# Patient Record
Sex: Male | Born: 1935 | Race: White | Hispanic: No | Marital: Married | Smoking: Never smoker
Health system: Southern US, Community
[De-identification: ages and names within clinical notes are randomized; demographics above are authoritative.]

## PROBLEM LIST (undated history)

## (undated) DIAGNOSIS — J1100 Influenza due to unidentified influenza virus with unspecified type of pneumonia: Secondary | ICD-10-CM

## (undated) DIAGNOSIS — E119 Type 2 diabetes mellitus without complications: Secondary | ICD-10-CM

## (undated) DIAGNOSIS — A35 Other tetanus: Secondary | ICD-10-CM

## (undated) DIAGNOSIS — E079 Disorder of thyroid, unspecified: Secondary | ICD-10-CM

## (undated) DIAGNOSIS — E1142 Type 2 diabetes mellitus with diabetic polyneuropathy: Secondary | ICD-10-CM

## (undated) DIAGNOSIS — I10 Essential (primary) hypertension: Secondary | ICD-10-CM

## (undated) DIAGNOSIS — E611 Iron deficiency: Secondary | ICD-10-CM

## (undated) DIAGNOSIS — I219 Acute myocardial infarction, unspecified: Secondary | ICD-10-CM

## (undated) DIAGNOSIS — D649 Anemia, unspecified: Secondary | ICD-10-CM

## (undated) DIAGNOSIS — I4891 Unspecified atrial fibrillation: Secondary | ICD-10-CM

## (undated) DIAGNOSIS — E11319 Type 2 diabetes mellitus with unspecified diabetic retinopathy without macular edema: Secondary | ICD-10-CM

## (undated) DIAGNOSIS — Z8719 Personal history of other diseases of the digestive system: Secondary | ICD-10-CM

## (undated) HISTORY — DX: Influenza due to unidentified influenza virus with unspecified type of pneumonia: J11.00

## (undated) HISTORY — DX: Personal history of other diseases of the digestive system: Z87.19

## (undated) HISTORY — PX: FETAL BLOOD TRANSFUSION: SHX1602

## (undated) HISTORY — PX: BACK SURGERY: SHX140

## (undated) HISTORY — PX: OTHER SURGICAL HISTORY: SHX169

## (undated) HISTORY — DX: Other tetanus: A35

## (undated) HISTORY — DX: Type 2 diabetes mellitus with diabetic polyneuropathy: E11.42

## (undated) HISTORY — DX: Type 2 diabetes mellitus without complications: E11.9

## (undated) HISTORY — DX: Unspecified atrial fibrillation: I48.91

## (undated) HISTORY — DX: Type 2 diabetes mellitus with unspecified diabetic retinopathy without macular edema: E11.319

## (undated) HISTORY — PX: VITRECTOMY: SHX106

## (undated) HISTORY — DX: Disorder of thyroid, unspecified: E07.9

## (undated) HISTORY — DX: Acute myocardial infarction, unspecified: I21.9

## (undated) HISTORY — PX: FOOT SURGERY: SHX648

## (undated) HISTORY — DX: Iron deficiency: E61.1

## (undated) HISTORY — DX: Anemia, unspecified: D64.9

## (undated) HISTORY — DX: Essential (primary) hypertension: I10

---

## 2004-05-09 ENCOUNTER — Ambulatory Visit: Payer: Self-pay | Admitting: Ophthalmology

## 2004-05-16 ENCOUNTER — Ambulatory Visit: Payer: Self-pay | Admitting: Ophthalmology

## 2004-11-27 ENCOUNTER — Ambulatory Visit: Payer: Self-pay

## 2004-11-28 ENCOUNTER — Ambulatory Visit: Payer: Self-pay

## 2004-12-29 ENCOUNTER — Ambulatory Visit: Payer: Self-pay

## 2006-06-18 ENCOUNTER — Ambulatory Visit: Payer: Self-pay | Admitting: Vascular Surgery

## 2006-12-11 ENCOUNTER — Ambulatory Visit: Payer: Self-pay | Admitting: Podiatry

## 2006-12-18 ENCOUNTER — Ambulatory Visit: Payer: Self-pay | Admitting: Podiatry

## 2007-01-07 ENCOUNTER — Ambulatory Visit: Payer: Self-pay | Admitting: Podiatry

## 2007-06-03 ENCOUNTER — Ambulatory Visit: Payer: Self-pay | Admitting: Unknown Physician Specialty

## 2008-09-10 ENCOUNTER — Ambulatory Visit: Payer: Self-pay | Admitting: Physician Assistant

## 2008-09-15 ENCOUNTER — Observation Stay: Payer: Self-pay | Admitting: Internal Medicine

## 2008-09-23 ENCOUNTER — Observation Stay: Payer: Self-pay | Admitting: Internal Medicine

## 2008-09-28 ENCOUNTER — Inpatient Hospital Stay: Payer: Self-pay | Admitting: Unknown Physician Specialty

## 2008-12-13 ENCOUNTER — Ambulatory Visit: Payer: Self-pay | Admitting: Podiatry

## 2009-04-30 DIAGNOSIS — J1100 Influenza due to unidentified influenza virus with unspecified type of pneumonia: Secondary | ICD-10-CM

## 2009-04-30 HISTORY — DX: Influenza due to unidentified influenza virus with unspecified type of pneumonia: J11.00

## 2012-04-24 ENCOUNTER — Ambulatory Visit: Payer: Self-pay | Admitting: Physical Medicine and Rehabilitation

## 2012-04-30 DIAGNOSIS — I4891 Unspecified atrial fibrillation: Secondary | ICD-10-CM

## 2012-04-30 DIAGNOSIS — I219 Acute myocardial infarction, unspecified: Secondary | ICD-10-CM

## 2012-04-30 HISTORY — DX: Unspecified atrial fibrillation: I48.91

## 2012-04-30 HISTORY — DX: Acute myocardial infarction, unspecified: I21.9

## 2013-02-21 ENCOUNTER — Observation Stay: Payer: Self-pay

## 2013-02-21 LAB — CK TOTAL AND CKMB (NOT AT ARMC): CK-MB: 4.4 ng/mL — ABNORMAL HIGH (ref 0.5–3.6)

## 2013-02-21 LAB — URINALYSIS, COMPLETE
Bacteria: NONE SEEN
Bilirubin,UR: NEGATIVE
Glucose,UR: 50 mg/dL (ref 0–75)
Leukocyte Esterase: NEGATIVE
Ph: 5 (ref 4.5–8.0)
Specific Gravity: 1.02 (ref 1.003–1.030)
Squamous Epithelial: NONE SEEN
WBC UR: <1 /HPF (ref 0–5)

## 2013-02-21 LAB — CBC
HCT: 39.4 % — ABNORMAL LOW (ref 40.0–52.0)
HGB: 13.9 g/dL (ref 13.0–18.0)
MCHC: 35.2 g/dL (ref 32.0–36.0)
MCV: 95 fL (ref 80–100)

## 2013-02-21 LAB — COMPREHENSIVE METABOLIC PANEL
BUN: 20 mg/dL — ABNORMAL HIGH (ref 7–18)
Bilirubin,Total: 0.6 mg/dL (ref 0.2–1.0)
Co2: 30 mmol/L (ref 21–32)
EGFR (African American): >60
Osmolality: 282 (ref 275–301)
SGOT(AST): 33 U/L (ref 15–37)
Sodium: 141 mmol/L (ref 136–145)
Total Protein: 7.1 g/dL (ref 6.4–8.2)

## 2013-02-21 LAB — PROTIME-INR
INR: 1
Prothrombin Time: 13.4 secs (ref 11.5–14.7)

## 2013-02-21 LAB — TROPONIN I: Troponin-I: <0.02 ng/mL

## 2013-02-22 DIAGNOSIS — I059 Rheumatic mitral valve disease, unspecified: Secondary | ICD-10-CM

## 2013-02-22 DIAGNOSIS — I10 Essential (primary) hypertension: Secondary | ICD-10-CM

## 2013-02-22 DIAGNOSIS — I4891 Unspecified atrial fibrillation: Secondary | ICD-10-CM

## 2013-02-22 LAB — BASIC METABOLIC PANEL
Anion Gap: 3 — ABNORMAL LOW (ref 7–16)
BUN: 20 mg/dL — ABNORMAL HIGH (ref 7–18)
Calcium, Total: 9.2 mg/dL (ref 8.5–10.1)
Chloride: 105 mmol/L (ref 98–107)
Co2: 32 mmol/L (ref 21–32)
EGFR (Non-African Amer.): 58 — ABNORMAL LOW
Osmolality: 284 (ref 275–301)
Potassium: 4.5 mmol/L (ref 3.5–5.1)
Sodium: 140 mmol/L (ref 136–145)

## 2013-02-22 LAB — HEMOGLOBIN A1C: Hemoglobin A1C: 7.1 % — ABNORMAL HIGH (ref 4.2–6.3)

## 2013-02-22 LAB — MAGNESIUM: Magnesium: 1.5 mg/dL — ABNORMAL LOW

## 2013-02-22 LAB — LIPID PANEL
HDL Cholesterol: 57 mg/dL (ref 40–60)
Ldl Cholesterol, Calc: 55 mg/dL (ref 0–100)

## 2013-02-23 ENCOUNTER — Telehealth: Payer: Self-pay

## 2013-02-23 NOTE — Telephone Encounter (Signed)
Reviewed with Dr. Mariah Milling, orders received to have the patient hold coreg and diltiazem today and take in extra fluids. Take coreg in the am if his pressure supports this and call the office after that with a BP reading. I have discussed this with the patient and his wife. They are aware to take amiodarone today at the prescribed dose and hold diltiazem and coreg today. The patient first confirmed that he took diltiazem last night after hospital discharge, but now he is uncertain. I have advised extra fluids today, then to check his BP in the morning. If his SBP is < 100, he will hold coreg and call the office. If his SBP is > 100, he will take coreg and then check BP 30 minutes to 1 hour after taking his medication. He will then call the office with his BP reading and to obtain further instructions on medications.

## 2013-02-23 NOTE — Telephone Encounter (Signed)
I spoke with the patient and his wife. He states that he took his medications last night about 7 pm ( coreg 6.25 mg, diltiazem120 mg, amiodarone 400 mg). He was on the couch about 10:30 pm and got up to check the front door. He felt a little dizzy walking to the door, but passed out as he got to the door. He did not hit his head, but did hit his side. BP after that episode was 79/45 HR- 69. This morning when he got up he was 80/48 HR- 76 about 7:45 am, and 95/50 HR- 74 just recently. He has not taken any medications this morning except his eliquis and his levothyroxine. I advised to hold coreg, diltiazem and amiodarone until I speak with Dr. Mariah Milling. He verbalizes understanding.

## 2013-02-23 NOTE — Telephone Encounter (Signed)
Pt wife called, states pt was seen by Dr Mariah Milling yesterday in the hospital. States he passed out last night, his BP is running low, last night 11:00 79/45 HR 69, this morning 80/48 HR 76, she wants to know if he should take the medication Dr. Mariah Milling prescribed due to his low BP. Please advise. Pt wife name is Arlene.

## 2013-02-24 NOTE — Telephone Encounter (Signed)
BP results reviewed with Dr. Mariah Milling. Orders received to have the patient hold coreg and diltiazem for the next few days and track BP's before just before meals and about 30 minutes to 1 hour after meals. Verified with the patient that his arm is at heart level when taking BP's. He will call our office on Friday morning with his BP readings and for further instructions from Dr. Mariah Milling regarding medications.

## 2013-02-24 NOTE — Telephone Encounter (Signed)
Would monitor blood pressures for now, hold coreg and diltiazem Continue amiodarone

## 2013-02-24 NOTE — Telephone Encounter (Signed)
The patient called this morning to report his BP reading. At 8 am (prior to medications) he was 126/14 HR- 82. He took his coreg 6.25mg  at 8 am and then repeated his BP just prior to calling me. BP now 83/52 HR- 76. He is experiencing no symptoms at this time. I advised him not to take any diltiazem this morning. Will forward to Dr. Mariah Milling for review. We will call him back with recommendations.

## 2013-02-25 NOTE — Telephone Encounter (Signed)
lmom 

## 2013-02-25 NOTE — Telephone Encounter (Signed)
Spoke w/ pt.  States that he was given instructions yesterday.   His readings yesterday: 121/74  70       119/71  77                    158/95  71  In the evening, so he took 3.125 mg coreg        104/57  87 Pt will continue to monitor and call us on Friday.

## 2013-02-27 NOTE — Telephone Encounter (Signed)
Would probably continue carvedilol 3.125 mg twice a day Continue amiodarone twice a day If blood pressure runs high, Would take extra Coreg 3.125 mg one or 2 doses when necessary   Would hold diltiazem as he has labile blood pressure, sometimes running low

## 2013-02-27 NOTE — Telephone Encounter (Signed)
Spoke w/ pt's wife.  She verbalizes understanding and will relay message to pt.

## 2013-02-27 NOTE — Telephone Encounter (Signed)
Pt called reporting bp and hr readings for 10/30:  Before breakfast: 104/64  80 After breakfast:    117/61  73 Before lunch:      140/79  85 After lunch:      175/84  76   Took carvedilol 3.125  144/82  91 Before supper:     124/82  89 After supper:      151/75  92 9:30pm:        167/102  78  Took carvedilol 3.125  Did not recheck This am:       132/72  69  Pt states that he feels ok, but reports that he has not had a BM since Monday, which is unusual for him. Reports constipation when he takes abx and is concerned this may be a side effect of meds.  Please advise.  Thank you.

## 2013-03-02 ENCOUNTER — Encounter (INDEPENDENT_AMBULATORY_CARE_PROVIDER_SITE_OTHER): Payer: Self-pay

## 2013-03-02 ENCOUNTER — Ambulatory Visit (INDEPENDENT_AMBULATORY_CARE_PROVIDER_SITE_OTHER): Payer: Medicare Other | Admitting: Cardiovascular Disease

## 2013-03-02 ENCOUNTER — Encounter: Payer: Self-pay | Admitting: Cardiovascular Disease

## 2013-03-02 VITALS — BP 153/72 | HR 78 | Ht 68.0 in | Wt 204.5 lb

## 2013-03-02 DIAGNOSIS — Z8719 Personal history of other diseases of the digestive system: Secondary | ICD-10-CM

## 2013-03-02 DIAGNOSIS — E109 Type 1 diabetes mellitus without complications: Secondary | ICD-10-CM | POA: Insufficient documentation

## 2013-03-02 DIAGNOSIS — I10 Essential (primary) hypertension: Secondary | ICD-10-CM

## 2013-03-02 DIAGNOSIS — I4891 Unspecified atrial fibrillation: Secondary | ICD-10-CM

## 2013-03-02 NOTE — Assessment & Plan Note (Signed)
We have suggested he call our office immediately for any dark stools. We'll continue anticoagulation for now

## 2013-03-02 NOTE — Assessment & Plan Note (Addendum)
Followed by Dr. Dareen Piano and endocrine, Dr. Tedd Sias.

## 2013-03-02 NOTE — Assessment & Plan Note (Signed)
Heart rate is reasonably well controlled. We have suggested he continue his amiodarone 200 mg twice a day. Repeat EKG in one week's time. We have suggested he start diltiazem  2 times a day if blood pressure tolerates. Would slowly titrate the diltiazem upwards if blood pressure does not drop. We'll plan for cardioversion in several weeks' time if he does not convert on his own

## 2013-03-02 NOTE — Patient Instructions (Addendum)
Please continue the coreg 3.1265 mg twice a day  Add diltiazem 30 mg pills for high blood pressure  We will do an ekg in one week If you are strill in atrial fibrillation, we can set up a cardioversion  Please call us if you have new issues that need to be addressed before your next appt.  Your physician wants you to follow-up in: 3 weeks.

## 2013-03-02 NOTE — Progress Notes (Signed)
Patient ID: Joseph Burgess, male    DOB: 1929/05/21, 79 y.o.   MRN: [id number]  HPI Comments: Joseph Burgess is a pleasant 79 year old gentleman with history of hypertension, diabetes on an insulin pump, hypothyroidism, GI bleed 4 years ago requiring clipping, from an AV malformation, presented to the emergency room 02/22/2013 with new atrial fibrillation. He had been in atrial fibrillation for less than 48 hours and was started on anticoagulation and amiodarone with discharge. Also sent home on diltiazem 120 mg daily.  Reports that he went home from the hospital, took extra diltiazem without knowing and the next day his blood pressure bottomed out. Marland Kitchen  He had near syncope, hurt his left flank from the fall. After several phone calls, he no longer takes his diltiazem. He has been taking Coreg 3.125 mg twice a day.He took amiodarone load 400 mg twice a day for 5 days then down to 200 mg twice a day Blood pressure has been running high. He does not take his losartan HCTZ but she was on previously prior to the hospital admission. This was held for the diltiazem. He has been tolerating anticoagulation without any dark stools  In followup today, he feels well with no complaints. Has occasional palpitations. Denies any shortness of breath. He is wearing compression hose. Blood work in the hospital showed hemoglobin A1c 7.1, cholesterol 124, LDL 55, TSH 31  Echocardiogram in the hospital showed normal LV systolic function, mildly dilated left atrium, mild LVH consistent with diastolic relaxation abnormality   EKG today shows atrial fibrillation with rate 88 beats per minute, no significant ST or T wave changes     Outpatient Encounter Prescriptions as of 03/02/2013  Medication Sig  . amiodarone (PACERONE) 200 MG tablet Take 200 mg by mouth 2 (two) times daily.  Marland Kitchen apixaban (ELIQUIS) 5 MG TABS tablet Take by mouth 2 (two) times daily.  . carvedilol (COREG) 6.25 MG tablet Take 6.25 mg by mouth 2 (two) times daily  with a meal. Take as directed.  Marland Kitchen levothyroxine (SYNTHROID, LEVOTHROID) 100 MCG tablet Take 100 mcg by mouth daily before breakfast.  . lovastatin (MEVACOR) 20 MG tablet Take 20 mg by mouth at bedtime.   Review of Systems  Constitutional: Negative.   HENT: Negative.   Eyes: Negative.   Respiratory: Negative.   Cardiovascular: Positive for palpitations.  Gastrointestinal: Negative.   Endocrine: Negative.   Musculoskeletal: Negative.   Skin: Negative.   Allergic/Immunologic: Negative.   Neurological: Negative.   Hematological: Negative.   Psychiatric/Behavioral: Negative.   All other systems reviewed and are negative.    BP 153/72  Pulse 78  Ht 5\' 8"  (1.727 m)  Wt 204 lb 8 oz (92.761 kg)  BMI 31.10 kg/m2  Physical Exam  Nursing note and vitals reviewed. Constitutional: He is oriented to person, place, and time. He appears well-developed and well-nourished.  HENT:  Head: Normocephalic.  Nose: Nose normal.  Mouth/Throat: Oropharynx is clear and moist.  Eyes: Conjunctivae are normal. Pupils are equal, round, and reactive to light.  Neck: Normal range of motion. Neck supple. No JVD present.  Cardiovascular: Normal rate, S1 normal, S2 normal, normal heart sounds and intact distal pulses.  An irregularly irregular rhythm present. Exam reveals no gallop and no friction rub.   No murmur heard. Pulmonary/Chest: Effort normal and breath sounds normal. No respiratory distress. He has no wheezes. He has no rales. He exhibits no tenderness.  Abdominal: Soft. Bowel sounds are normal. He exhibits no distension. There is no  tenderness.  Musculoskeletal: Normal range of motion. He exhibits no edema and no tenderness.  Lymphadenopathy:    He has no cervical adenopathy.  Neurological: He is alert and oriented to person, place, and time. Coordination normal.  Skin: Skin is warm and dry. No rash noted. No erythema.  Psychiatric: He has a normal mood and affect. His behavior is normal. Judgment  and thought content normal.      Assessment and Plan

## 2013-03-02 NOTE — Assessment & Plan Note (Signed)
Blood pressures high. We'll add low doses of diltiazem 30 mg several times per day if tolerated

## 2013-03-05 ENCOUNTER — Other Ambulatory Visit: Payer: Self-pay

## 2013-03-09 ENCOUNTER — Ambulatory Visit (INDEPENDENT_AMBULATORY_CARE_PROVIDER_SITE_OTHER): Payer: Medicare Other

## 2013-03-09 ENCOUNTER — Telehealth: Payer: Self-pay

## 2013-03-09 ENCOUNTER — Other Ambulatory Visit: Payer: Medicare Other

## 2013-03-09 VITALS — BP 163/84 | HR 83 | Ht 68.0 in | Wt 214.2 lb

## 2013-03-09 DIAGNOSIS — I4891 Unspecified atrial fibrillation: Secondary | ICD-10-CM

## 2013-03-09 NOTE — Patient Instructions (Signed)
Your physician has recommended that you have a Cardioversion (DCCV).  Electrical Cardioversion uses a jolt of electricity to your heart either through paddles or wired patches attached to your chest.  This is a controlled, usually prescheduled, procedure.  Defibrillation is done under light anesthesia in the hospital, and you usually go home the day of the procedure.  This is done to get your heart back into a normal rhythm. You are not awake for the procedure.  Please see the instruction sheet given to you today.  Please arrive at the [facility] entrance of [facility] Wednesday, Nov 12 @ 7:15am.

## 2013-03-09 NOTE — Telephone Encounter (Signed)
Spoke w/ pt's wife.  Notified her that cardioversion resched from 8:15 to 7:30. Pt aware to arrive at [facility] medical mall at 6:30.

## 2013-03-11 ENCOUNTER — Ambulatory Visit: Payer: Self-pay | Admitting: Cardiovascular Disease

## 2013-03-11 DIAGNOSIS — I4892 Unspecified atrial flutter: Secondary | ICD-10-CM

## 2013-03-12 ENCOUNTER — Telehealth: Payer: Self-pay

## 2013-03-12 NOTE — Telephone Encounter (Signed)
Pt calling to report BP & HR readings since cardioversion:  Yesterday: 12:00  85/47, 64 3:30 135/68, 63 6:00 115/60, 63 Today: 8:00 129/59, 67 10:00 137/65, 69  Reports that he is "feeling good."

## 2013-03-12 NOTE — Telephone Encounter (Signed)
Would stay on same medications for now Can discuss changes in followup

## 2013-03-13 NOTE — Telephone Encounter (Signed)
Spoke w/ pt's wife.  Reports that pt passed out last night.  Reports that BP was "something over 49" afterwards.   States pt is overly concerned about his BP, he monitors it "constantly" and if it is slightly elevated even once, he will take an extra carvedilol, which wife believes is dropping his BP too low, leading to his syncope.  Instructed wife to tell pt to only monitor his BP at a max of once a day, she states that she will hide the monitors from him. Stressed the importance of pt not to adjust his own meds and to call the office if he has questions about his medications or readings. States that she will discuss this with Dr. Mariah Milling at next office visit, as she will accompany pt to this visit.

## 2013-03-16 ENCOUNTER — Encounter: Payer: Self-pay | Admitting: Cardiovascular Disease

## 2013-03-17 ENCOUNTER — Ambulatory Visit: Payer: Medicare Other | Admitting: Cardiovascular Disease

## 2013-03-17 ENCOUNTER — Ambulatory Visit (INDEPENDENT_AMBULATORY_CARE_PROVIDER_SITE_OTHER): Payer: Medicare Other | Admitting: Cardiovascular Disease

## 2013-03-17 ENCOUNTER — Encounter: Payer: Self-pay | Admitting: Cardiovascular Disease

## 2013-03-17 VITALS — BP 168/78 | HR 54 | Ht 68.0 in | Wt 206.5 lb

## 2013-03-17 DIAGNOSIS — E109 Type 1 diabetes mellitus without complications: Secondary | ICD-10-CM

## 2013-03-17 DIAGNOSIS — I10 Essential (primary) hypertension: Secondary | ICD-10-CM

## 2013-03-17 DIAGNOSIS — I4891 Unspecified atrial fibrillation: Secondary | ICD-10-CM

## 2013-03-17 NOTE — Assessment & Plan Note (Signed)
Maintaining NSR, bradycardia. We will decrease amiodarone down to 200 mg daily, hold ditiazem as he had leg swelling, restart coreg 6.25 mg BID, continue eliquis

## 2013-03-17 NOTE — Patient Instructions (Signed)
You are doing well.  Please restart losartan HCTZ 100/25 mg daily Please restart coreg 6.25 mg twice a day Decrease the amiodarone down to one a day  Use diltiazem 30 mg pills if needed for high blood pressure, fast heart rate  Please call us if you have new issues that need to be addressed before your next appt.  Your physician wants you to follow-up in: 3 weeks

## 2013-03-17 NOTE — Assessment & Plan Note (Signed)
We have encouraged continued exercise, careful diet management in an effort to lose weight. 

## 2013-03-17 NOTE — Progress Notes (Signed)
Patient ID: Joseph Burgess, male    DOB: 1930-03-26, 79 y.o.   MRN: [id number]  HPI Comments: Joseph Burgess is a pleasant 79 year old gentleman with history of hypertension, diabetes on an insulin pump, hypothyroidism, GI bleed 4 years ago requiring clipping, from an AV malformation, presented to the emergency room 02/22/2013 with new atrial fibrillation. He had been in atrial fibrillation for less than 48 hours and was started on anticoagulation and amiodarone with discharge. Also sent home on diltiazem 120 mg daily.   he went home from the hospital, took extra diltiazem without knowing and the next day his blood pressure bottomed out. Marland Kitchen  He had near syncope, hurt his left flank from the fall. After several phone calls, he no longer takes his diltiazem. He has been taking Coreg 3.125 mg twice a day.He took amiodarone load 400 mg twice a day for 5 days then down to 200 mg twice a day Blood pressure has been running high.  losartan HCTZ held in the hospital,  Started on diltiazem. He has been tolerating anticoagulation without any dark stools  He was scheduled for cardioversion, which was successful in restoring NSR  In followup today, he has been taking his wifes diltiazem 180 mg pills, has nausea, decreased appetite and malaise. He wonders if this could be secondary to the amiodarone. He reports having worsening lower extremity edema.   Blood work in the hospital showed hemoglobin A1c 7.1, cholesterol 124, LDL 55, TSH 31  Echocardiogram in the hospital showed normal LV systolic function, mildly dilated left atrium, mild LVH consistent with diastolic relaxation abnormality   EKG today shows NSR with rate 54 bpm,      Outpatient Encounter Prescriptions as of 03/17/2013  Medication Sig  . amiodarone (PACERONE) 200 MG tablet Take 200 mg by mouth 2 (two) times daily.  Marland Kitchen apixaban (ELIQUIS) 5 MG TABS tablet Take by mouth 2 (two) times daily.  . carvedilol (COREG) 3.125 MG tablet Take 3.125 mg by mouth as  needed.  . diltiazem (CARDIZEM) 30 MG tablet Take 30 mg by mouth as needed.  . diltiazem (DILACOR XR) 180 MG 24 hr capsule Take 180 mg by mouth daily.  Marland Kitchen levothyroxine (SYNTHROID, LEVOTHROID) 100 MCG tablet Take 100 mcg by mouth daily before breakfast.  . lovastatin (MEVACOR) 20 MG tablet Take 20 mg by mouth at bedtime.  . [DISCONTINUED] carvedilol (COREG) 6.25 MG tablet Take 6.25 mg by mouth 2 (two) times daily with a meal. Take as directed.  . [DISCONTINUED] diltiazem (CARDIZEM) 30 MG tablet Take 1 tablet (30 mg total) by mouth 4 (four) times daily as needed.    Review of Systems  Constitutional: Negative.   HENT: Negative.   Eyes: Negative.   Respiratory: Negative.   Cardiovascular: Positive for palpitations.  Gastrointestinal: Negative.   Endocrine: Negative.   Musculoskeletal: Negative.   Skin: Negative.   Allergic/Immunologic: Negative.   Neurological: Negative.   Hematological: Negative.   Psychiatric/Behavioral: Negative.   All other systems reviewed and are negative.    BP 168/78  Pulse 54  Ht 5\' 8"  (1.727 m)  Wt 206 lb 8 oz (93.668 kg)  BMI 31.41 kg/m2  Physical Exam  Nursing note and vitals reviewed. Constitutional: He is oriented to person, place, and time. He appears well-developed and well-nourished.  HENT:  Head: Normocephalic.  Nose: Nose normal.  Mouth/Throat: Oropharynx is clear and moist.  Eyes: Conjunctivae are normal. Pupils are equal, round, and reactive to light.  Neck: Normal range of motion. Neck  supple. No JVD present.  Cardiovascular: Normal rate, S1 normal, S2 normal, normal heart sounds and intact distal pulses.  An irregularly irregular rhythm present. Exam reveals no gallop and no friction rub.   No murmur heard. Pulmonary/Chest: Effort normal and breath sounds normal. No respiratory distress. He has no wheezes. He has no rales. He exhibits no tenderness.  Abdominal: Soft. Bowel sounds are normal. He exhibits no distension. There is no  tenderness.  Musculoskeletal: Normal range of motion. He exhibits no edema and no tenderness.  Lymphadenopathy:    He has no cervical adenopathy.  Neurological: He is alert and oriented to person, place, and time. Coordination normal.  Skin: Skin is warm and dry. No rash noted. No erythema.  Psychiatric: He has a normal mood and affect. His behavior is normal. Judgment and thought content normal.      Assessment and Plan

## 2013-03-17 NOTE — Assessment & Plan Note (Signed)
Medication changes as above. We will restart losartan HCTZ, coreg, hold the diltiazem

## 2013-03-23 ENCOUNTER — Encounter: Payer: Self-pay | Admitting: *Deleted

## 2013-03-23 ENCOUNTER — Ambulatory Visit: Payer: Medicare Other | Admitting: Cardiovascular Disease

## 2013-03-23 ENCOUNTER — Telehealth: Payer: Self-pay

## 2013-03-23 NOTE — Telephone Encounter (Signed)
Looks okay Would try to keep blood pressure from getting too high Also we'll try to keep heart rate from getting too high Perhaps adding Coreg 3.125 mg before bed? This would help numbers in the morning

## 2013-03-23 NOTE — Telephone Encounter (Signed)
Spoke w/ pt.  He would like to report his BP & HR readings from 11/19-11/24:  11/19: 7:30: 126/58   56 Took 1/2 losartan & 3.125 mg coreg 4:00: 161/77   59 Hs: 97/47  Took 30mg  dilitazem  11/20: 8:00:  138/69 52 Took 1/2 losartan @ 3.125 mg coreg 11:00: 107/54 50 5:00: 145/69 56 Took 30mg  diltiazem Hs: 129/59 55  11/21: 8:00 154/78 53 Took 1/2 losartan & 30mg  diltiazem 12:30 112/56 58  6:00 146/65 56  Hs: 158/87 58 Took 30 diltiazem  11/22: 8:00 138/68 65 Took 1/2 losartan & 30mg  diltiazem 1:00 116/57 61 6:00 158/82 56 Took 3.125 mg coreg Hs: 109/64 58  11/23: 8:00 123/63 58 Took 1/2 losartan 1:00 103/59 56 6:00 137/69 71 Took 30mg  diltiazem Hs: 137/78  Took 30mg  diltiazem  11/24: 8:00 142/74 58 Took 1/2 losartan 11:00 147/64 53 1:30 138/57 69 Took 30mg  diltiazem

## 2013-03-24 NOTE — Telephone Encounter (Signed)
Spoke w/ pt.  He is agreeable to plan and will call next week with more BP & HR readings.

## 2013-03-31 ENCOUNTER — Other Ambulatory Visit: Payer: Self-pay

## 2013-03-31 DIAGNOSIS — K922 Gastrointestinal hemorrhage, unspecified: Secondary | ICD-10-CM

## 2013-03-31 DIAGNOSIS — D649 Anemia, unspecified: Secondary | ICD-10-CM

## 2013-04-01 ENCOUNTER — Telehealth: Payer: Self-pay

## 2013-04-01 ENCOUNTER — Ambulatory Visit (INDEPENDENT_AMBULATORY_CARE_PROVIDER_SITE_OTHER): Payer: Medicare Other

## 2013-04-01 VITALS — BP 98/48 | HR 63 | Ht 68.0 in | Wt 200.0 lb

## 2013-04-01 DIAGNOSIS — K922 Gastrointestinal hemorrhage, unspecified: Secondary | ICD-10-CM

## 2013-04-01 DIAGNOSIS — I4891 Unspecified atrial fibrillation: Secondary | ICD-10-CM

## 2013-04-01 DIAGNOSIS — D649 Anemia, unspecified: Secondary | ICD-10-CM

## 2013-04-01 LAB — CBC WITH DIFFERENTIAL/PLATELET
Basophils Absolute: 0 10*3/uL (ref 0.0–0.2)
Basos: 1 %
Eos: 4 %
HCT: 24.3 % — ABNORMAL LOW (ref 37.5–51.0)
Hemoglobin: 8.1 g/dL — ABNORMAL LOW (ref 12.6–17.7)
Immature Granulocytes: 0 %
Lymphocytes Absolute: 1.1 10*3/uL (ref 0.7–3.1)
Lymphs: 31 %
MCV: 99 fL — ABNORMAL HIGH (ref 79–97)
Monocytes Absolute: 0.4 10*3/uL (ref 0.1–0.9)
Monocytes: 10 %
Neutrophils Absolute: 2 10*3/uL (ref 1.4–7.0)
RBC: 2.45 x10E6/uL — CL (ref 4.14–5.80)
WBC: 3.7 10*3/uL (ref 3.4–10.8)

## 2013-04-01 MED ORDER — FERROUS SULFATE 325 (65 FE) MG PO TABS: 325.0000 mg | ORAL_TABLET | Freq: Three times a day (TID) | ORAL | Status: DC

## 2013-04-01 NOTE — Telephone Encounter (Signed)
Spoke w/ pt regarding stat lab results.  Instructed pt to take iron pills, per Dr. Mariah Milling. He reports that he has some pills at home, but they are about 77 years old and he would like a new rx sent in.

## 2013-04-01 NOTE — Progress Notes (Signed)
Pt presented to office today for EKG and lab draw. C/o black stools and dizziness on standing. Reports that he is concerned, as he has "all the side effects that come with amiodarone" and he "had these symptoms when I got that ulcer in 2010". Pt has not spoken with his GI MD about concerns, as he wants lab results back before he does so. Pt has not taken Eliquis per Dr. Windell Hummingbird instructions. Dr. Mariah Milling reviewed EKG.  Reviewed w/ pt and informed him that rhythm is normal and Dr. Mariah Milling would like for him to continue amiodarone. Pt would like to know if labs come back ok, if Dr. Mariah Milling would consider changing from amiodarone to something with fewer side effects at a later time. Pt to start omeprazole 20mg  BID, verbalizes that wife is currently taking this and has plenty at home.  Pt to wait on results of stat labs drawn today for further instructions.

## 2013-04-02 ENCOUNTER — Encounter: Payer: Self-pay | Admitting: Cardiovascular Disease

## 2013-04-03 ENCOUNTER — Telehealth: Payer: Self-pay

## 2013-04-03 NOTE — Telephone Encounter (Signed)
Spoke w/ pt. He is aware of results.  Reports he is still having dark stoolsand is not feeling any better or stronger since starting iron and he feels about the same as he did when he had GI bleed in 2010. Pt has an appt w/ Dr. Mechele Collin on Mon at 3:30.

## 2013-04-03 NOTE — Telephone Encounter (Signed)
Message copied by Marilynne Halsted on Fri Apr 03, 2013 10:55 AM ------      Message from: Antonieta Iba      Created: Thu Apr 02, 2013 10:49 PM       Can we call to see if maroon stools/gi bleeding has stopped      We need to recheck HCT next week again      thx       ------

## 2013-04-06 NOTE — Telephone Encounter (Signed)
We'll need to track his blood count. Perhaps Dr. Mechele Collin can recheck this With his anemia, Anemia will take several weeks for him to come back up Not low enough for transfusion We'll need to rely on iron in his diet and time He will feel tired while he is anemic

## 2013-04-07 ENCOUNTER — Inpatient Hospital Stay: Payer: Self-pay | Admitting: Internal Medicine

## 2013-04-07 ENCOUNTER — Telehealth: Payer: Self-pay

## 2013-04-07 DIAGNOSIS — I4892 Unspecified atrial flutter: Secondary | ICD-10-CM

## 2013-04-07 DIAGNOSIS — R112 Nausea with vomiting, unspecified: Secondary | ICD-10-CM

## 2013-04-07 DIAGNOSIS — R7989 Other specified abnormal findings of blood chemistry: Secondary | ICD-10-CM

## 2013-04-07 LAB — BASIC METABOLIC PANEL
Anion Gap: 7 (ref 7–16)
BUN: 49 mg/dL — ABNORMAL HIGH (ref 7–18)
Calcium, Total: 9.7 mg/dL (ref 8.5–10.1)
Co2: 29 mmol/L (ref 21–32)
Creatinine: 1.69 mg/dL — ABNORMAL HIGH (ref 0.60–1.30)
EGFR (African American): 43 — ABNORMAL LOW
EGFR (Non-African Amer.): 37 — ABNORMAL LOW
Osmolality: 291 (ref 275–301)

## 2013-04-07 LAB — APTT: Activated PTT: 27.2 secs (ref 23.6–35.9)

## 2013-04-07 LAB — CBC
HGB: 9.5 g/dL — ABNORMAL LOW (ref 13.0–18.0)
Platelet: 295 10*3/uL (ref 150–440)
RDW: 14.7 % — ABNORMAL HIGH (ref 11.5–14.5)

## 2013-04-07 LAB — CK-MB
CK-MB: 14.5 ng/mL — ABNORMAL HIGH (ref 0.5–3.6)
CK-MB: 15.3 ng/mL — ABNORMAL HIGH (ref 0.5–3.6)

## 2013-04-07 LAB — TROPONIN I: Troponin-I: 2.4 ng/mL — ABNORMAL HIGH

## 2013-04-07 LAB — CK TOTAL AND CKMB (NOT AT ARMC)
CK, Total: 156 U/L (ref 35–232)
CK-MB: 13.1 ng/mL — ABNORMAL HIGH (ref 0.5–3.6)

## 2013-04-07 LAB — PRO B NATRIURETIC PEPTIDE: B-Type Natriuretic Peptide: 10561 pg/mL — ABNORMAL HIGH (ref 0–450)

## 2013-04-07 LAB — PROTIME-INR: INR: 1

## 2013-04-07 NOTE — Telephone Encounter (Signed)
Spoke w/ pt's wife.  She wanted to update Dr. Mariah Milling on appt w/ GI MD. Reports that pt is losing blood in his stool, but it is not from previous duodenal ulcer. Reports pt did not tolerate iron, as it gave him severe indigestion and kept him up all night, so pt will not take it.  Dr. Earnest Conroy office put him on liquid diet x 2 days and then soft diet x 5 days. Reports that Dr. Earnest Conroy office "found scratching in his lungs and a murmur that wasn't there before". She would like Dr. Mariah Milling to evaluate this and discuss possible side effects from his current meds.  Reports pt has not taken BP meds, as his BP has been low and that his kidney function is decreased.  Pt sched to see Dr. Mariah Milling Thursday at 11:15.

## 2013-04-07 NOTE — Telephone Encounter (Signed)
Pt wife called and asks to talk to [place]. Would like to discuss pt visit yesterday with the GI dr.

## 2013-04-08 ENCOUNTER — Ambulatory Visit: Payer: Medicare Other | Admitting: Cardiovascular Disease

## 2013-04-08 DIAGNOSIS — I059 Rheumatic mitral valve disease, unspecified: Secondary | ICD-10-CM

## 2013-04-08 DIAGNOSIS — I214 Non-ST elevation (NSTEMI) myocardial infarction: Secondary | ICD-10-CM

## 2013-04-08 DIAGNOSIS — I10 Essential (primary) hypertension: Secondary | ICD-10-CM

## 2013-04-08 LAB — CBC WITH DIFFERENTIAL/PLATELET
Basophil #: 0 10*3/uL (ref 0.0–0.1)
HGB: 10.9 g/dL — ABNORMAL LOW (ref 13.0–18.0)
Lymphocyte %: 9.3 %
MCH: 31.5 pg (ref 26.0–34.0)
MCHC: 33.8 g/dL (ref 32.0–36.0)
Monocyte %: 6.1 %
Neutrophil #: 6.7 10*3/uL — ABNORMAL HIGH (ref 1.4–6.5)
RBC: 3.46 10*6/uL — ABNORMAL LOW (ref 4.40–5.90)
RDW: 14.7 % — ABNORMAL HIGH (ref 11.5–14.5)
WBC: 8.1 10*3/uL (ref 3.8–10.6)

## 2013-04-08 LAB — IRON AND TIBC
Iron Bind.Cap.(Total): 347 ug/dL (ref 250–450)
Iron Saturation: 69 %
Unbound Iron-Bind.Cap.: 108 ug/dL

## 2013-04-08 LAB — BASIC METABOLIC PANEL
BUN: 45 mg/dL — ABNORMAL HIGH (ref 7–18)
Chloride: 97 mmol/L — ABNORMAL LOW (ref 98–107)
Creatinine: 1.42 mg/dL — ABNORMAL HIGH (ref 0.60–1.30)
EGFR (African American): 53 — ABNORMAL LOW
EGFR (Non-African Amer.): 46 — ABNORMAL LOW
Glucose: 187 mg/dL — ABNORMAL HIGH (ref 65–99)
Potassium: 3.9 mmol/L (ref 3.5–5.1)
Sodium: 133 mmol/L — ABNORMAL LOW (ref 136–145)

## 2013-04-08 LAB — MAGNESIUM: Magnesium: 1.1 mg/dL — ABNORMAL LOW

## 2013-04-08 NOTE — Telephone Encounter (Signed)
See previous phone encounter.

## 2013-04-09 ENCOUNTER — Ambulatory Visit: Payer: Medicare Other | Admitting: Cardiovascular Disease

## 2013-04-09 LAB — BODY FLUID CELL COUNT WITH DIFFERENTIAL
Basophil: 0 %
Eosinophil: 0 %
Lymphocytes: 82 %
Nucleated Cell Count: 215 /mm3
Other Cells BF: 0 %

## 2013-04-09 LAB — BASIC METABOLIC PANEL
Anion Gap: 7 (ref 7–16)
Calcium, Total: 9 mg/dL (ref 8.5–10.1)
Creatinine: 1.71 mg/dL — ABNORMAL HIGH (ref 0.60–1.30)
EGFR (African American): 43 — ABNORMAL LOW
EGFR (Non-African Amer.): 37 — ABNORMAL LOW
Glucose: 220 mg/dL — ABNORMAL HIGH (ref 65–99)
Potassium: 4.5 mmol/L (ref 3.5–5.1)
Sodium: 130 mmol/L — ABNORMAL LOW (ref 136–145)

## 2013-04-09 LAB — AMYLASE, BODY FLUID: Amylase, Body Fluid: 5 U/L

## 2013-04-09 LAB — CBC WITH DIFFERENTIAL/PLATELET
Eosinophil #: 0 10*3/uL (ref 0.0–0.7)
Eosinophil %: 0 %
HGB: 10.6 g/dL — ABNORMAL LOW (ref 13.0–18.0)
Lymphocyte #: 0.4 10*3/uL — ABNORMAL LOW (ref 1.0–3.6)
Lymphocyte %: 5 %
MCH: 33.4 pg (ref 26.0–34.0)
MCHC: 35.1 g/dL (ref 32.0–36.0)
Monocyte %: 0.8 %
RBC: 3.18 10*6/uL — ABNORMAL LOW (ref 4.40–5.90)
RDW: 14.9 % — ABNORMAL HIGH (ref 11.5–14.5)
WBC: 7.1 10*3/uL (ref 3.8–10.6)

## 2013-04-09 LAB — GLUCOSE, SEROUS FLUID

## 2013-04-09 LAB — SEDIMENTATION RATE: Erythrocyte Sed Rate: 40 mm/hr — ABNORMAL HIGH (ref 0–20)

## 2013-04-09 LAB — LACTATE DEHYDROGENASE, PLEURAL OR PERITONEAL FLUID: LDH, Body Fluid: 78 U/L

## 2013-04-10 ENCOUNTER — Ambulatory Visit: Payer: Self-pay | Admitting: Internal Medicine

## 2013-04-10 DIAGNOSIS — I5021 Acute systolic (congestive) heart failure: Secondary | ICD-10-CM

## 2013-04-10 LAB — BASIC METABOLIC PANEL
BUN: 59 mg/dL — ABNORMAL HIGH (ref 7–18)
BUN: 62 mg/dL — ABNORMAL HIGH (ref 7–18)
Calcium, Total: 8.8 mg/dL (ref 8.5–10.1)
Calcium, Total: 8.8 mg/dL (ref 8.5–10.1)
Chloride: 93 mmol/L — ABNORMAL LOW (ref 98–107)
Co2: 25 mmol/L (ref 21–32)
Co2: 30 mmol/L (ref 21–32)
Creatinine: 1.95 mg/dL — ABNORMAL HIGH (ref 0.60–1.30)
EGFR (African American): 37 — ABNORMAL LOW
EGFR (African American): 36 — ABNORMAL LOW
EGFR (Non-African Amer.): 32 — ABNORMAL LOW
EGFR (Non-African Amer.): 31 — ABNORMAL LOW
Sodium: 127 mmol/L — ABNORMAL LOW (ref 136–145)
Sodium: 129 mmol/L — ABNORMAL LOW (ref 136–145)

## 2013-04-10 LAB — CBC WITH DIFFERENTIAL/PLATELET
Basophil %: 0.1 %
Eosinophil %: 0 %
HCT: 36 % — ABNORMAL LOW (ref 40.0–52.0)
Lymphocyte #: 0.2 10*3/uL — ABNORMAL LOW (ref 1.0–3.6)
Lymphocyte %: 1.4 %
MCH: 31.6 pg (ref 26.0–34.0)
MCHC: 33.3 g/dL (ref 32.0–36.0)
MCV: 95 fL (ref 80–100)
Monocyte %: 3.9 %
Neutrophil #: 13 10*3/uL — ABNORMAL HIGH (ref 1.4–6.5)
Neutrophil %: 94.6 %
Platelet: 295 10*3/uL (ref 150–440)
RBC: 3.79 10*6/uL — ABNORMAL LOW (ref 4.40–5.90)
RDW: 14.8 % — ABNORMAL HIGH (ref 11.5–14.5)
WBC: 13.8 10*3/uL — ABNORMAL HIGH (ref 3.8–10.6)

## 2013-04-10 LAB — TROPONIN I
Troponin-I: 1.8 ng/mL — ABNORMAL HIGH
Troponin-I: 3.3 ng/mL — ABNORMAL HIGH

## 2013-04-10 LAB — MAGNESIUM: Magnesium: 1.7 mg/dL — ABNORMAL LOW

## 2013-04-11 LAB — CBC WITH DIFFERENTIAL/PLATELET
Basophil #: 0 10*3/uL (ref 0.0–0.1)
Basophil %: 0.1 %
HCT: 29.1 % — ABNORMAL LOW (ref 40.0–52.0)
HGB: 9.8 g/dL — ABNORMAL LOW (ref 13.0–18.0)
MCH: 31.6 pg (ref 26.0–34.0)
MCV: 94 fL (ref 80–100)
Monocyte %: 3.8 %
RBC: 3.11 10*6/uL — ABNORMAL LOW (ref 4.40–5.90)
WBC: 11.1 10*3/uL — ABNORMAL HIGH (ref 3.8–10.6)

## 2013-04-11 LAB — POTASSIUM: Potassium: 3.3 mmol/L — ABNORMAL LOW (ref 3.5–5.1)

## 2013-04-11 LAB — BASIC METABOLIC PANEL
Anion Gap: 5 — ABNORMAL LOW (ref 7–16)
BUN: 59 mg/dL — ABNORMAL HIGH (ref 7–18)
Calcium, Total: 8.2 mg/dL — ABNORMAL LOW (ref 8.5–10.1)
Chloride: 90 mmol/L — ABNORMAL LOW (ref 98–107)
Co2: 35 mmol/L — ABNORMAL HIGH (ref 21–32)
Creatinine: 1.71 mg/dL — ABNORMAL HIGH (ref 0.60–1.30)
EGFR (Non-African Amer.): 37 — ABNORMAL LOW
Osmolality: 283 (ref 275–301)
Potassium: 3.5 mmol/L (ref 3.5–5.1)
Sodium: 130 mmol/L — ABNORMAL LOW (ref 136–145)

## 2013-04-12 LAB — OCCULT BLOOD X 1 CARD TO LAB, STOOL: Occult Blood, Feces: NEGATIVE

## 2013-04-12 LAB — CBC WITH DIFFERENTIAL/PLATELET
Eosinophil #: 0 10*3/uL (ref 0.0–0.7)
Eosinophil %: 0 %
HCT: 31.5 % — ABNORMAL LOW (ref 40.0–52.0)
HGB: 10.7 g/dL — ABNORMAL LOW (ref 13.0–18.0)
Lymphocyte #: 0.2 10*3/uL — ABNORMAL LOW (ref 1.0–3.6)
MCHC: 33.8 g/dL (ref 32.0–36.0)
MCV: 93 fL (ref 80–100)
Monocyte #: 0.4 x10 3/mm (ref 0.2–1.0)
Monocyte %: 3.3 %
Neutrophil #: 11.8 10*3/uL — ABNORMAL HIGH (ref 1.4–6.5)
Neutrophil %: 94.9 %
Platelet: 242 10*3/uL (ref 150–440)
RBC: 3.39 10*6/uL — ABNORMAL LOW (ref 4.40–5.90)
WBC: 12.4 10*3/uL — ABNORMAL HIGH (ref 3.8–10.6)

## 2013-04-12 LAB — CULTURE, BLOOD (SINGLE)

## 2013-04-12 LAB — MAGNESIUM: Magnesium: 1.6 mg/dL — ABNORMAL LOW

## 2013-04-12 LAB — BASIC METABOLIC PANEL
Co2: 39 mmol/L — ABNORMAL HIGH (ref 21–32)
Creatinine: 1.46 mg/dL — ABNORMAL HIGH (ref 0.60–1.30)
Glucose: 195 mg/dL — ABNORMAL HIGH (ref 65–99)
Osmolality: 288 (ref 275–301)
Potassium: 3 mmol/L — ABNORMAL LOW (ref 3.5–5.1)
Sodium: 134 mmol/L — ABNORMAL LOW (ref 136–145)

## 2013-04-13 LAB — BASIC METABOLIC PANEL
BUN: 53 mg/dL — ABNORMAL HIGH (ref 7–18)
Calcium, Total: 8.3 mg/dL — ABNORMAL LOW (ref 8.5–10.1)
Chloride: 93 mmol/L — ABNORMAL LOW (ref 98–107)
EGFR (African American): 54 — ABNORMAL LOW
EGFR (Non-African Amer.): 47 — ABNORMAL LOW
Glucose: 210 mg/dL — ABNORMAL HIGH (ref 65–99)
Osmolality: 294 (ref 275–301)
Sodium: 137 mmol/L (ref 136–145)

## 2013-04-13 LAB — CBC WITH DIFFERENTIAL/PLATELET
Basophil #: 0 10*3/uL (ref 0.0–0.1)
HCT: 31.2 % — ABNORMAL LOW (ref 40.0–52.0)
HGB: 10.3 g/dL — ABNORMAL LOW (ref 13.0–18.0)
Lymphocyte #: 0.2 10*3/uL — ABNORMAL LOW (ref 1.0–3.6)
MCH: 31.1 pg (ref 26.0–34.0)
MCV: 94 fL (ref 80–100)
Monocyte #: 0.4 x10 3/mm (ref 0.2–1.0)
Neutrophil #: 10.2 10*3/uL — ABNORMAL HIGH (ref 1.4–6.5)
RDW: 14.8 % — ABNORMAL HIGH (ref 11.5–14.5)
WBC: 10.8 10*3/uL — ABNORMAL HIGH (ref 3.8–10.6)

## 2013-04-13 LAB — MAGNESIUM: Magnesium: 2.3 mg/dL

## 2013-04-13 LAB — BODY FLUID CULTURE

## 2013-04-14 LAB — CBC WITH DIFFERENTIAL/PLATELET
Basophil #: 0 10*3/uL (ref 0.0–0.1)
Eosinophil #: 0 10*3/uL (ref 0.0–0.7)
HGB: 10.7 g/dL — ABNORMAL LOW (ref 13.0–18.0)
Lymphocyte #: 0.5 10*3/uL — ABNORMAL LOW (ref 1.0–3.6)
MCH: 31.8 pg (ref 26.0–34.0)
MCHC: 34.2 g/dL (ref 32.0–36.0)
MCV: 93 fL (ref 80–100)
Monocyte #: 0.5 x10 3/mm (ref 0.2–1.0)
Monocyte %: 5.1 %
Neutrophil %: 89.7 %
Platelet: 219 10*3/uL (ref 150–440)
RBC: 3.38 10*6/uL — ABNORMAL LOW (ref 4.40–5.90)

## 2013-04-14 LAB — BASIC METABOLIC PANEL
Anion Gap: 2 — ABNORMAL LOW (ref 7–16)
BUN: 49 mg/dL — ABNORMAL HIGH (ref 7–18)
Calcium, Total: 8.8 mg/dL (ref 8.5–10.1)
EGFR (African American): >60
EGFR (Non-African Amer.): 53 — ABNORMAL LOW
Potassium: 3.5 mmol/L (ref 3.5–5.1)

## 2013-04-15 LAB — BASIC METABOLIC PANEL
Anion Gap: 0 — ABNORMAL LOW (ref 7–16)
Calcium, Total: 9.1 mg/dL (ref 8.5–10.1)
Chloride: 92 mmol/L — ABNORMAL LOW (ref 98–107)
Creatinine: 1.4 mg/dL — ABNORMAL HIGH (ref 0.60–1.30)
EGFR (African American): 54 — ABNORMAL LOW
Glucose: 272 mg/dL — ABNORMAL HIGH (ref 65–99)
Osmolality: 293 (ref 275–301)
Potassium: 3.6 mmol/L (ref 3.5–5.1)

## 2013-04-15 LAB — CBC WITH DIFFERENTIAL/PLATELET
Basophil #: 0 10*3/uL (ref 0.0–0.1)
Basophil %: 0.2 %
HCT: 32.3 % — ABNORMAL LOW (ref 40.0–52.0)
HGB: 10.8 g/dL — ABNORMAL LOW (ref 13.0–18.0)
Lymphocyte #: 0.4 10*3/uL — ABNORMAL LOW (ref 1.0–3.6)
Lymphocyte %: 4.6 %
MCHC: 33.5 g/dL (ref 32.0–36.0)
MCV: 93 fL (ref 80–100)
Monocyte #: 0.4 x10 3/mm (ref 0.2–1.0)
Neutrophil #: 8 10*3/uL — ABNORMAL HIGH (ref 1.4–6.5)
Neutrophil %: 90.9 %
RBC: 3.48 10*6/uL — ABNORMAL LOW (ref 4.40–5.90)
RDW: 14.5 % (ref 11.5–14.5)
WBC: 8.8 10*3/uL (ref 3.8–10.6)

## 2013-04-16 DIAGNOSIS — R0602 Shortness of breath: Secondary | ICD-10-CM

## 2013-04-16 LAB — BASIC METABOLIC PANEL
Anion Gap: 3 — ABNORMAL LOW (ref 7–16)
Calcium, Total: 9 mg/dL (ref 8.5–10.1)
Chloride: 91 mmol/L — ABNORMAL LOW (ref 98–107)
Co2: 42 mmol/L (ref 21–32)
Creatinine: 1.35 mg/dL — ABNORMAL HIGH (ref 0.60–1.30)
EGFR (African American): 57 — ABNORMAL LOW
EGFR (Non-African Amer.): 49 — ABNORMAL LOW
Glucose: 204 mg/dL — ABNORMAL HIGH (ref 65–99)
Osmolality: 290 (ref 275–301)
Potassium: 3.5 mmol/L (ref 3.5–5.1)
Sodium: 136 mmol/L (ref 136–145)

## 2013-04-16 LAB — CBC WITH DIFFERENTIAL/PLATELET
Basophil %: 0.4 %
Eosinophil #: 0.1 10*3/uL (ref 0.0–0.7)
Eosinophil #: 0 10*3/uL (ref 0.0–0.7)
Eosinophil %: 0.6 %
HCT: 33.5 % — ABNORMAL LOW (ref 40.0–52.0)
Lymphocyte #: 0.8 10*3/uL — ABNORMAL LOW (ref 1.0–3.6)
Lymphocyte #: 0.3 10*3/uL — ABNORMAL LOW (ref 1.0–3.6)
Lymphocyte %: 8.8 %
Lymphocyte %: 3.3 %
MCH: 30.4 pg (ref 26.0–34.0)
MCHC: 33.2 g/dL (ref 32.0–36.0)
MCHC: 32.7 g/dL (ref 32.0–36.0)
Monocyte #: 0.5 x10 3/mm (ref 0.2–1.0)
Monocyte %: 5.3 %
Monocyte %: 3 %
Neutrophil #: 7.6 10*3/uL — ABNORMAL HIGH (ref 1.4–6.5)
Neutrophil #: 8.2 10*3/uL — ABNORMAL HIGH (ref 1.4–6.5)
Neutrophil %: 84.9 %
RBC: 3.45 10*6/uL — ABNORMAL LOW (ref 4.40–5.90)
RBC: 3.6 10*6/uL — ABNORMAL LOW (ref 4.40–5.90)
RDW: 14.8 % — ABNORMAL HIGH (ref 11.5–14.5)
RDW: 15 % — ABNORMAL HIGH (ref 11.5–14.5)
WBC: 8.8 10*3/uL (ref 3.8–10.6)

## 2013-04-16 LAB — HEMOGLOBIN: HGB: 11 g/dL — ABNORMAL LOW (ref 13.0–18.0)

## 2013-04-17 DIAGNOSIS — I5021 Acute systolic (congestive) heart failure: Secondary | ICD-10-CM

## 2013-04-17 DIAGNOSIS — I214 Non-ST elevation (NSTEMI) myocardial infarction: Secondary | ICD-10-CM

## 2013-04-17 DIAGNOSIS — I4891 Unspecified atrial fibrillation: Secondary | ICD-10-CM

## 2013-04-17 LAB — URINALYSIS, COMPLETE
Bacteria: NONE SEEN
Bilirubin,UR: NEGATIVE
Glucose,UR: 150 mg/dL (ref 0–75)
Hyaline Cast: 6
Ketone: NEGATIVE
Nitrite: NEGATIVE
Ph: 6 (ref 4.5–8.0)
Protein: NEGATIVE
RBC,UR: 2 /HPF (ref 0–5)
Specific Gravity: 1.015 (ref 1.003–1.030)
Squamous Epithelial: 6
WBC UR: 12 /HPF (ref 0–5)

## 2013-04-17 LAB — COMPREHENSIVE METABOLIC PANEL
Albumin: 2.3 g/dL — ABNORMAL LOW (ref 3.4–5.0)
Anion Gap: 4 — ABNORMAL LOW (ref 7–16)
Bilirubin,Total: 0.7 mg/dL (ref 0.2–1.0)
Calcium, Total: 9.3 mg/dL (ref 8.5–10.1)
Chloride: 91 mmol/L — ABNORMAL LOW (ref 98–107)
Co2: 44 mmol/L (ref 21–32)
Creatinine: 1.47 mg/dL — ABNORMAL HIGH (ref 0.60–1.30)
Glucose: 47 mg/dL — ABNORMAL LOW (ref 65–99)
Osmolality: 287 (ref 275–301)
Total Protein: 5.7 g/dL — ABNORMAL LOW (ref 6.4–8.2)

## 2013-04-17 LAB — CBC WITH DIFFERENTIAL/PLATELET
Basophil #: 0 10*3/uL (ref 0.0–0.1)
HCT: 35.1 % — ABNORMAL LOW (ref 40.0–52.0)
HGB: 11.6 g/dL — ABNORMAL LOW (ref 13.0–18.0)
Lymphocyte #: 0.5 10*3/uL — ABNORMAL LOW (ref 1.0–3.6)
Lymphocyte %: 5.6 %
MCH: 30.4 pg (ref 26.0–34.0)
MCV: 93 fL (ref 80–100)
Monocyte #: 0.5 x10 3/mm (ref 0.2–1.0)
Monocyte %: 5 %
Neutrophil %: 88.7 %
Platelet: 249 10*3/uL (ref 150–440)
RBC: 3.77 10*6/uL — ABNORMAL LOW (ref 4.40–5.90)
WBC: 9.8 10*3/uL (ref 3.8–10.6)

## 2013-04-18 LAB — URINE CULTURE

## 2013-04-19 LAB — CBC WITH DIFFERENTIAL/PLATELET
Basophil #: 0 10*3/uL (ref 0.0–0.1)
Basophil %: 0.3 %
Eosinophil %: 0.7 %
HCT: 30.6 % — ABNORMAL LOW (ref 40.0–52.0)
MCH: 30.5 pg (ref 26.0–34.0)
MCHC: 33.1 g/dL (ref 32.0–36.0)
MCV: 92 fL (ref 80–100)
Monocyte #: 0.4 x10 3/mm (ref 0.2–1.0)
Monocyte %: 5.6 %
Neutrophil #: 5.5 10*3/uL (ref 1.4–6.5)
Neutrophil %: 83.5 %
RBC: 3.33 10*6/uL — ABNORMAL LOW (ref 4.40–5.90)
RDW: 15 % — ABNORMAL HIGH (ref 11.5–14.5)
WBC: 6.6 10*3/uL (ref 3.8–10.6)

## 2013-04-19 LAB — BASIC METABOLIC PANEL
BUN: 49 mg/dL — ABNORMAL HIGH (ref 7–18)
Chloride: 94 mmol/L — ABNORMAL LOW (ref 98–107)
Creatinine: 1.44 mg/dL — ABNORMAL HIGH (ref 0.60–1.30)
EGFR (African American): 52 — ABNORMAL LOW
EGFR (Non-African Amer.): 45 — ABNORMAL LOW
Glucose: 146 mg/dL — ABNORMAL HIGH (ref 65–99)
Osmolality: 289 (ref 275–301)
Potassium: 4.3 mmol/L (ref 3.5–5.1)
Sodium: 137 mmol/L (ref 136–145)

## 2013-04-21 ENCOUNTER — Telehealth: Payer: Self-pay

## 2013-04-21 NOTE — Telephone Encounter (Signed)
Patient contacted regarding discharge from [facility] on 04/20/13.  Patient understands to follow up with provider Dr. Mariah Milling on 04/27/13 at 11:30 at [facility]. Patient understands discharge instructions? yes Patient understands medications and regiment? yes Patient understands to bring all medications to this visit? yes

## 2013-04-25 ENCOUNTER — Telehealth: Payer: Self-pay | Admitting: Nurse Practitioner

## 2013-04-25 NOTE — Telephone Encounter (Signed)
Contacted by RN with [facility].  Mr. Caven's BP has been running low (in the 90's) and as a result, his family held his morning dose of coreg today.  He is also on losartan-hctz 50-12.5.  I recommended that if BP's low tomorrow, given his h/o afib, it would be better to hold losartan-hctz and give coreg if appropriate.  HHRN verbalized understanding and will touch base with family.

## 2013-04-27 ENCOUNTER — Encounter: Payer: Medicare Other | Admitting: Cardiovascular Disease

## 2013-04-30 ENCOUNTER — Ambulatory Visit: Payer: Self-pay | Admitting: Internal Medicine

## 2013-05-05 ENCOUNTER — Ambulatory Visit (INDEPENDENT_AMBULATORY_CARE_PROVIDER_SITE_OTHER): Payer: Medicare Other | Admitting: Cardiovascular Disease

## 2013-05-05 ENCOUNTER — Encounter: Payer: Self-pay | Admitting: Cardiovascular Disease

## 2013-05-05 VITALS — BP 112/80 | HR 76 | Ht 68.0 in | Wt 186.0 lb

## 2013-05-05 DIAGNOSIS — E109 Type 1 diabetes mellitus without complications: Secondary | ICD-10-CM

## 2013-05-05 DIAGNOSIS — K922 Gastrointestinal hemorrhage, unspecified: Secondary | ICD-10-CM | POA: Insufficient documentation

## 2013-05-05 DIAGNOSIS — I951 Orthostatic hypotension: Secondary | ICD-10-CM

## 2013-05-05 DIAGNOSIS — I4891 Unspecified atrial fibrillation: Secondary | ICD-10-CM

## 2013-05-05 DIAGNOSIS — I255 Ischemic cardiomyopathy: Secondary | ICD-10-CM

## 2013-05-05 DIAGNOSIS — I2510 Atherosclerotic heart disease of native coronary artery without angina pectoris: Secondary | ICD-10-CM | POA: Insufficient documentation

## 2013-05-05 MED ORDER — MIDODRINE HCL 5 MG PO TABS: 5.0000 mg | ORAL_TABLET | Freq: Three times a day (TID) | ORAL | Status: DC | PRN

## 2013-05-05 NOTE — Assessment & Plan Note (Signed)
Recent GI bleed on eliquis. Hematocrit stable. 4 recurrent arrhythmia, will likely have to use no anticoagulation

## 2013-05-05 NOTE — Assessment & Plan Note (Addendum)
Maintaining normal sinus rhythm. Not a good candidate for anticoagulation given recent GI bleed. Unable to tolerate more than very low doses of coronary secondary to labile pressure and hypotension. Amiodarone previously held. Likely could restart this without any complications. He was held in the hospital. Will discuss this with him in followup

## 2013-05-05 NOTE — Assessment & Plan Note (Signed)
Ejection fraction 25-35% on recent echo. Unable to advance his medical regiment given frequent periods of hypotension

## 2013-05-05 NOTE — Patient Instructions (Addendum)
You are doing well. No medication changes were made.  We will set up cardiac rehab for mid January Goal weight 180 to 182 pounds  Please call us if you have new issues that need to be addressed before your next appt.  Your physician wants you to follow-up in: 2 weeks

## 2013-05-05 NOTE — Assessment & Plan Note (Addendum)
Recent stress test informed secondary to renal dysfunction and patient wishes. 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 Unable to exclude multivessel disease. No large regions of ischemia. He is a poor candidate for cardiac catheterization given renal dysfunction and recent GI bleeding. As he is doing well with no significant symptoms of angina, we'll hold off on any invasive testing unless he develops symptoms. This was discussed with the patient and his wife

## 2013-05-05 NOTE — Progress Notes (Signed)
Patient ID: Joseph Burgess, male    DOB: April 13, 1930, 80 y.o.   MRN: [id number]  HPI Comments: Joseph Burgess is a pleasant 80 year old gentleman with history of hypertension, diabetes on an insulin pump, hypothyroidism, GI bleed 4 years ago requiring clipping, from an AV malformation, presented to the emergency room 02/22/2013 with new atrial fibrillation. Started on rate control medications and cardioverted as an outpatient in 03/11/2013. He was started on Eliquis 5 mg twice a day prior to the cardioversion and continued on anticoagulation for several weeks after the cardioversion. He stop the anticoagulation secondary to melanotic stools. Treated with iron pills.  On 04/07/2013 he was admitted to the hospital with nausea, vomiting after taking iron pills. He had elevated cardiac enzymes, initial troponin 2.4 and CK-MB of 13, hematocrit 27.8. During his hospital course he had acute respiratory failure, found to have decompensated cardiac function with ejection fraction 25-35%, down from normal ejection fraction 2 months prior. He had diuresis for fluid overload, pleural effusions, BiPAP for a short period of time. Myoview showing mid to distal anterior and anteroseptal, mid to distal inferior scar with no regions of ischemia. Cardiac catheterization was not performed given the elevated renal function. Also so he was not a good candidate for stenting given recent GI bleed. He is evaluated by Dr. Mechele Collin during his hospital course. Creatinine on discharge is 1.44. Amiodarone was held by primary care physician to ensure no underlying pulmonary pathology. He was discharged on 04/20/2013  In followup today, he reports that he is getting his strength back. Legs are getting stronger. He is walking short distances. Continues on nasal cannula oxygen. Minimal cough, able to lie flat with nasal cannula oxygen. Denies having any chest pain. No melanotic stools.  He has been unable to tolerate any carvedilol, ACE inhibitor or  Cardizem secondary to low blood pressure. Blood pressures have been very labile, sometimes 70 systolic, later in the day will be 100 2001 130. Frequent episodes of systolics in the 80s and 90s sometimes associated with weakness.  Blood work in the hospital showed hemoglobin A1c 7.1, cholesterol 124, LDL 55, TSH 31 Most recent echocardiogram showing ejection fraction 25-35% in December 2014  EKG today shows NSR with rate 76 bpm, baseline artifact     Outpatient Encounter Prescriptions as of 05/05/2013  Medication Sig  . carvedilol (COREG) 3.125 MG tablet Take 3.125 mg by mouth as needed.  . diltiazem (CARDIZEM) 30 MG tablet Take 30 mg by mouth as needed.  Marland Kitchen KLOR-CON M10 10 MEQ tablet Take 10 mEq by mouth once.   Marland Kitchen levothyroxine (SYNTHROID, LEVOTHROID) 100 MCG tablet Take 100 mcg by mouth daily before breakfast.  . lovastatin (MEVACOR) 20 MG tablet Take 20 mg by mouth at bedtime.  . torsemide (DEMADEX) 20 MG tablet Take 10 mg by mouth once.     Review of Systems  Constitutional: Negative.   HENT: Negative.   Eyes: Negative.   Respiratory: Positive for shortness of breath.   Gastrointestinal: Negative.   Endocrine: Negative.   Musculoskeletal: Positive for gait problem.  Skin: Negative.   Allergic/Immunologic: Negative.   Neurological: Negative.   Hematological: Negative.   Psychiatric/Behavioral: Negative.   All other systems reviewed and are negative.    BP 112/80  Pulse 76  Ht 5\' 8"  (1.727 m)  Wt 186 lb (84.369 kg)  BMI 28.29 kg/m2  Physical Exam  Nursing note and vitals reviewed. Constitutional: He is oriented to person, place, and time. He appears well-developed and well-nourished.  HENT:  Head: Normocephalic.  Nose: Nose normal.  Mouth/Throat: Oropharynx is clear and moist.  Eyes: Conjunctivae are normal. Pupils are equal, round, and reactive to light.  Neck: Normal range of motion. Neck supple. No JVD present.  Cardiovascular: Normal rate, S1 normal, S2 normal,  normal heart sounds and intact distal pulses.  An irregularly irregular rhythm present. Exam reveals no gallop and no friction rub.   No murmur heard. Pulmonary/Chest: Effort normal. No respiratory distress. He has decreased breath sounds. He has no wheezes. He has no rales. He exhibits no tenderness.  Abdominal: Soft. Bowel sounds are normal. He exhibits no distension. There is no tenderness.  Musculoskeletal: Normal range of motion. He exhibits no edema and no tenderness.  Lymphadenopathy:    He has no cervical adenopathy.  Neurological: He is alert and oriented to person, place, and time. Coordination normal.  Skin: Skin is warm and dry. No rash noted. No erythema.  Psychiatric: He has a normal mood and affect. His behavior is normal. Judgment and thought content normal.      Assessment and Plan

## 2013-05-05 NOTE — Assessment & Plan Note (Signed)
Frequent periods of hypotension, unable to tolerate medications. Seems unrelated to his doses of carvedilol which are very small and sporadic. Suggested he has symptomatic hypotension with no quick relief, he could take midodrine 2.5 or 5 mg as needed up to 3 times a day

## 2013-05-05 NOTE — Assessment & Plan Note (Signed)
We have encouraged continued exercise, careful diet management in an effort to lose weight. 

## 2013-05-11 ENCOUNTER — Encounter: Payer: Self-pay | Admitting: *Deleted

## 2013-05-19 DIAGNOSIS — I951 Orthostatic hypotension: Secondary | ICD-10-CM

## 2013-05-19 DIAGNOSIS — E109 Type 1 diabetes mellitus without complications: Secondary | ICD-10-CM

## 2013-05-19 DIAGNOSIS — I2589 Other forms of chronic ischemic heart disease: Secondary | ICD-10-CM

## 2013-05-19 DIAGNOSIS — I2510 Atherosclerotic heart disease of native coronary artery without angina pectoris: Secondary | ICD-10-CM

## 2013-05-19 DIAGNOSIS — K922 Gastrointestinal hemorrhage, unspecified: Secondary | ICD-10-CM

## 2013-05-20 ENCOUNTER — Ambulatory Visit: Payer: Medicare Other | Admitting: Cardiovascular Disease

## 2013-05-29 ENCOUNTER — Encounter (INDEPENDENT_AMBULATORY_CARE_PROVIDER_SITE_OTHER): Payer: Self-pay

## 2013-06-04 ENCOUNTER — Ambulatory Visit (INDEPENDENT_AMBULATORY_CARE_PROVIDER_SITE_OTHER): Payer: Medicare Other | Admitting: Cardiovascular Disease

## 2013-06-04 ENCOUNTER — Encounter: Payer: Self-pay | Admitting: Cardiovascular Disease

## 2013-06-04 VITALS — BP 128/61 | HR 59 | Ht 68.0 in | Wt 181.0 lb

## 2013-06-04 DIAGNOSIS — I255 Ischemic cardiomyopathy: Secondary | ICD-10-CM

## 2013-06-04 DIAGNOSIS — I2510 Atherosclerotic heart disease of native coronary artery without angina pectoris: Secondary | ICD-10-CM

## 2013-06-04 DIAGNOSIS — I4891 Unspecified atrial fibrillation: Secondary | ICD-10-CM

## 2013-06-04 DIAGNOSIS — I2589 Other forms of chronic ischemic heart disease: Secondary | ICD-10-CM

## 2013-06-04 DIAGNOSIS — E109 Type 1 diabetes mellitus without complications: Secondary | ICD-10-CM

## 2013-06-04 DIAGNOSIS — I951 Orthostatic hypotension: Secondary | ICD-10-CM

## 2013-06-04 DIAGNOSIS — K922 Gastrointestinal hemorrhage, unspecified: Secondary | ICD-10-CM

## 2013-06-04 NOTE — Assessment & Plan Note (Signed)
Prior history of GI bleeding in December 2014. We'll start low-dose aspirin 81 mg daily

## 2013-06-04 NOTE — Assessment & Plan Note (Signed)
Fixed perfusion defect consistent with scar on most recent stress test December 2014. Currently with no symptoms of angina. Recovering well. Currently with no plan for cardiac catheterization. Creatinine 1.7

## 2013-06-04 NOTE — Patient Instructions (Signed)
You are doing well. No medication changes were made.  Please take extra torsemide as needed for weight greater than 181 pounds   Please call us if you have new issues that need to be addressed before your next appt.  Your physician wants you to follow-up in: 6 months.  You will receive a reminder letter in the mail two months in advance. If you don't receive a letter, please call our office to schedule the follow-up appointment.

## 2013-06-04 NOTE — Assessment & Plan Note (Signed)
Recent massive MI at the end of 2014. We'll schedule him for cardiac rehabilitation now that he is stronger. Pleural effusions have resolved. Off oxygen, breathing better

## 2013-06-04 NOTE — Assessment & Plan Note (Signed)
We have encouraged continued exercise, careful diet management in an effort to lose weight. 

## 2013-06-04 NOTE — Assessment & Plan Note (Signed)
Improved symptoms of dizziness from orthostatic hypotension with midodrine 5 mg twice a day. Able to tolerate low-dose carvedilol twice a day

## 2013-06-04 NOTE — Assessment & Plan Note (Signed)
Maintaining normal sinus rhythm. No changes to his medications. Continue low-dose carvedilol

## 2013-06-04 NOTE — Progress Notes (Signed)
Patient ID: Joseph Burgess, male    DOB: December 09, 1929, 80 y.o.   MRN: [id number]  HPI Comments: Joseph Burgess is a pleasant 80 year old gentleman with history of hypertension, diabetes on an insulin pump, hypothyroidism, GI bleed 4 years ago requiring clipping, from an AV malformation, presented to the emergency room 02/22/2013 with new atrial fibrillation. Started on rate control medications and cardioverted as an outpatient in 03/11/2013. He was started on Eliquis 5 mg twice a day prior to the cardioversion and continued on anticoagulation for several weeks after the cardioversion. He stop the anticoagulation secondary to melanotic stools. Treated with iron pills.  On 04/07/2013 he was admitted to the hospital with nausea, vomiting after taking iron pills. He had elevated cardiac enzymes, initial troponin 2.4 and CK-MB of 13, hematocrit 27.8. During his hospital course he had acute respiratory failure, found to have decompensated cardiac function with ejection fraction 25-35%, down from normal ejection fraction 2 months prior. He had diuresis for fluid overload, pleural effusions, BiPAP for a short period of time. Myoview showing mid to distal anterior and anteroseptal, mid to distal inferior scar with no regions of ischemia. Cardiac catheterization was not performed given the elevated renal function. Also so he was not a good candidate for stenting given recent GI bleed. He is evaluated by Dr. Mechele CollinElliott during his hospital course. Creatinine on discharge is 1.44. Amiodarone was held by primary care physician to ensure no underlying pulmonary pathology. He was discharged on 04/20/2013   In followup today, he looks well, is walking more, is off oxygen. Able to lay flat without any coughing or shortness of breath. He has been taking midodrine 5 mg twice a day with carvedilol twice a day. Reports that with this combination, blood pressure does not drop, is much less labile. Without midodrine, he has rapid drops in  his blood pressure with symptomatic orthostasis. He has reached a steady state in his weight between 178 and 181 pounds. Hemoglobin 10.2 by his report He denies any tachycardia concerning for atrial fibrillation  Most recent cholesterol was elevated at 185, LDL 119. This is surprising as he continues on lovastatin and prior total cholesterol was in the 120 range  echocardiogram showing ejection fraction 25-35% in December 2014  EKG today shows NSR with rate 59 bpm,  nonspecific ST and T wave abnormality anterolateral and inferior leads     Outpatient Encounter Prescriptions as of 06/04/2013  Medication Sig  . carvedilol (COREG) 3.125 MG tablet Take 3.125 mg by mouth 2 (two) times daily with a meal.   . diltiazem (CARDIZEM) 30 MG tablet Take 30 mg by mouth as needed.  Marland Kitchen. KLOR-CON M10 10 MEQ tablet Take 10 mEq by mouth once.   Marland Kitchen. levothyroxine (SYNTHROID, LEVOTHROID) 100 MCG tablet Take 100 mcg by mouth daily before breakfast.  . lovastatin (MEVACOR) 20 MG tablet Take 20 mg by mouth at bedtime.  . midodrine (PROAMATINE) 5 MG tablet Take 1 tablet (5 mg total) by mouth 3 (three) times daily as needed.  Marland Kitchen. omeprazole (PRILOSEC) 20 MG capsule Take 1 capsule (20 mg total) by mouth daily.  Marland Kitchen. torsemide (DEMADEX) 20 MG tablet Take 20 mg by mouth once.     Review of Systems  Constitutional: Negative.   HENT: Negative.   Eyes: Negative.   Cardiovascular: Negative.   Gastrointestinal: Negative.   Endocrine: Negative.   Musculoskeletal: Positive for gait problem.  Skin: Negative.   Allergic/Immunologic: Negative.   Neurological: Negative.   Hematological: Negative.   Psychiatric/Behavioral:  Negative.   All other systems reviewed and are negative.    BP 128/61  Pulse 59  Ht 5\' 8"  (1.727 m)  Wt 181 lb (82.101 kg)  BMI 27.53 kg/m2  Physical Exam  Nursing note and vitals reviewed. Constitutional: He is oriented to person, place, and time. He appears well-developed and well-nourished.   HENT:  Head: Normocephalic.  Nose: Nose normal.  Mouth/Throat: Oropharynx is clear and moist.  Eyes: Conjunctivae are normal. Pupils are equal, round, and reactive to light.  Neck: Normal range of motion. Neck supple. No JVD present.  Cardiovascular: Normal rate, S1 normal, S2 normal, normal heart sounds and intact distal pulses.  An irregularly irregular rhythm present. Exam reveals no gallop and no friction rub.   No murmur heard. Pulmonary/Chest: Effort normal. No respiratory distress. He has decreased breath sounds. He has no wheezes. He has no rales. He exhibits no tenderness.  Abdominal: Soft. Bowel sounds are normal. He exhibits no distension. There is no tenderness.  Musculoskeletal: Normal range of motion. He exhibits no edema and no tenderness.  Lymphadenopathy:    He has no cervical adenopathy.  Neurological: He is alert and oriented to person, place, and time. Coordination normal.  Skin: Skin is warm and dry. No rash noted. No erythema.  Psychiatric: He has a normal mood and affect. His behavior is normal. Judgment and thought content normal.      Assessment and Plan

## 2013-06-11 ENCOUNTER — Encounter: Payer: Self-pay | Admitting: Cardiovascular Disease

## 2013-07-01 ENCOUNTER — Encounter: Payer: Self-pay | Admitting: Cardiovascular Disease

## 2013-07-02 ENCOUNTER — Telehealth: Payer: Self-pay

## 2013-07-02 ENCOUNTER — Encounter: Payer: Self-pay | Admitting: Cardiovascular Disease

## 2013-07-02 NOTE — Telephone Encounter (Signed)
Received MyChart message from pt's wife that pt has had indigestion and vomiting for the past few days.  Called pt and he asks that I speak w/ his wife.  Wife reports that pt has been feeling bad and has not been going to [facility] b/c he feels worn out.  Pt denies any chest or jaw pain. Reports BP readings of 84/52, 86/51, 88/48, 86/46, 85/49 for the past 2 days.  States that pt has been taking his carvedilol and midodrine at the same time, as "that is the only way he can function".  She is concerned, as she feels that pt is regressing after making so much progress, as now he is so weak that he does not want to do anything. She would like to know Dr. Windell Hummingbird recommendation, as she feels this is r/t his heart. Please advise.  Thank you.

## 2013-07-02 NOTE — Telephone Encounter (Signed)
How much coronary disease taking? How much midodrine if he taking?  Would cut the carvedilol down to once a day Can increase the midodrine up to 10 mg if he is on 5 mg, take up to 3 times a day

## 2013-07-02 NOTE — Telephone Encounter (Signed)
Spoke w/ pt's wife.  Advised her of Dr. Windell Hummingbird recommendations.  She states that pt is currently taking 5 mg midodrine and she is agreeable w/ Dr. Windell Hummingbird suggestion. She will call tomorrow if pt is not feeling any better.

## 2013-07-11 ENCOUNTER — Telehealth: Payer: Self-pay | Admitting: Physician Assistant

## 2013-07-11 ENCOUNTER — Inpatient Hospital Stay (HOSPITAL_COMMUNITY)
Admission: EM | Admit: 2013-07-11 | Discharge: 2013-07-18 | DRG: 286 | Disposition: A | Discharge status: Hospice | Payer: Medicare Other | Attending: Internal Medicine | Admitting: Internal Medicine

## 2013-07-11 ENCOUNTER — Encounter (HOSPITAL_COMMUNITY): Payer: Self-pay | Admitting: Emergency Medicine

## 2013-07-11 DIAGNOSIS — E876 Hypokalemia: Secondary | ICD-10-CM

## 2013-07-11 DIAGNOSIS — E1069 Type 1 diabetes mellitus with other specified complication: Secondary | ICD-10-CM | POA: Diagnosis present

## 2013-07-11 DIAGNOSIS — I5023 Acute on chronic systolic (congestive) heart failure: Secondary | ICD-10-CM

## 2013-07-11 DIAGNOSIS — E1121 Type 2 diabetes mellitus with diabetic nephropathy: Secondary | ICD-10-CM | POA: Diagnosis present

## 2013-07-11 DIAGNOSIS — I255 Ischemic cardiomyopathy: Secondary | ICD-10-CM | POA: Diagnosis present

## 2013-07-11 DIAGNOSIS — N179 Acute kidney failure, unspecified: Secondary | ICD-10-CM

## 2013-07-11 DIAGNOSIS — Z79899 Other long term (current) drug therapy: Secondary | ICD-10-CM

## 2013-07-11 DIAGNOSIS — Z794 Long term (current) use of insulin: Secondary | ICD-10-CM

## 2013-07-11 DIAGNOSIS — R55 Syncope and collapse: Secondary | ICD-10-CM

## 2013-07-11 DIAGNOSIS — N039 Chronic nephritic syndrome with unspecified morphologic changes: Secondary | ICD-10-CM

## 2013-07-11 DIAGNOSIS — I2589 Other forms of chronic ischemic heart disease: Secondary | ICD-10-CM | POA: Diagnosis present

## 2013-07-11 DIAGNOSIS — E1129 Type 2 diabetes mellitus with other diabetic kidney complication: Secondary | ICD-10-CM

## 2013-07-11 DIAGNOSIS — I252 Old myocardial infarction: Secondary | ICD-10-CM

## 2013-07-11 DIAGNOSIS — N184 Chronic kidney disease, stage 4 (severe): Secondary | ICD-10-CM | POA: Diagnosis present

## 2013-07-11 DIAGNOSIS — E1029 Type 1 diabetes mellitus with other diabetic kidney complication: Secondary | ICD-10-CM | POA: Diagnosis present

## 2013-07-11 DIAGNOSIS — N058 Unspecified nephritic syndrome with other morphologic changes: Secondary | ICD-10-CM | POA: Diagnosis present

## 2013-07-11 DIAGNOSIS — N183 Chronic kidney disease, stage 3 unspecified: Secondary | ICD-10-CM | POA: Diagnosis present

## 2013-07-11 DIAGNOSIS — E109 Type 1 diabetes mellitus without complications: Secondary | ICD-10-CM

## 2013-07-11 DIAGNOSIS — I2510 Atherosclerotic heart disease of native coronary artery without angina pectoris: Secondary | ICD-10-CM

## 2013-07-11 DIAGNOSIS — E11319 Type 2 diabetes mellitus with unspecified diabetic retinopathy without macular edema: Secondary | ICD-10-CM | POA: Diagnosis present

## 2013-07-11 DIAGNOSIS — N189 Chronic kidney disease, unspecified: Secondary | ICD-10-CM

## 2013-07-11 DIAGNOSIS — I428 Other cardiomyopathies: Secondary | ICD-10-CM

## 2013-07-11 DIAGNOSIS — I4891 Unspecified atrial fibrillation: Secondary | ICD-10-CM

## 2013-07-11 DIAGNOSIS — Z66 Do not resuscitate: Secondary | ICD-10-CM | POA: Diagnosis present

## 2013-07-11 DIAGNOSIS — R7989 Other specified abnormal findings of blood chemistry: Secondary | ICD-10-CM

## 2013-07-11 DIAGNOSIS — R778 Other specified abnormalities of plasma proteins: Secondary | ICD-10-CM

## 2013-07-11 DIAGNOSIS — E039 Hypothyroidism, unspecified: Secondary | ICD-10-CM | POA: Diagnosis present

## 2013-07-11 DIAGNOSIS — I5022 Chronic systolic (congestive) heart failure: Secondary | ICD-10-CM

## 2013-07-11 DIAGNOSIS — Z515 Encounter for palliative care: Secondary | ICD-10-CM

## 2013-07-11 DIAGNOSIS — E785 Hyperlipidemia, unspecified: Secondary | ICD-10-CM | POA: Diagnosis present

## 2013-07-11 DIAGNOSIS — I951 Orthostatic hypotension: Principal | ICD-10-CM

## 2013-07-11 DIAGNOSIS — R570 Cardiogenic shock: Secondary | ICD-10-CM

## 2013-07-11 DIAGNOSIS — D649 Anemia, unspecified: Secondary | ICD-10-CM

## 2013-07-11 DIAGNOSIS — Z9641 Presence of insulin pump (external) (internal): Secondary | ICD-10-CM

## 2013-07-11 DIAGNOSIS — IMO0001 Reserved for inherently not codable concepts without codable children: Secondary | ICD-10-CM

## 2013-07-11 DIAGNOSIS — T502X5A Adverse effect of carbonic-anhydrase inhibitors, benzothiadiazides and other diuretics, initial encounter: Secondary | ICD-10-CM | POA: Diagnosis present

## 2013-07-11 DIAGNOSIS — D631 Anemia in chronic kidney disease: Secondary | ICD-10-CM | POA: Diagnosis present

## 2013-07-11 DIAGNOSIS — I509 Heart failure, unspecified: Secondary | ICD-10-CM | POA: Diagnosis present

## 2013-07-11 DIAGNOSIS — E875 Hyperkalemia: Secondary | ICD-10-CM

## 2013-07-11 DIAGNOSIS — E1039 Type 1 diabetes mellitus with other diabetic ophthalmic complication: Secondary | ICD-10-CM | POA: Diagnosis present

## 2013-07-11 LAB — CBC WITH DIFFERENTIAL/PLATELET
Basophils Absolute: 0 10*3/uL (ref 0.0–0.1)
Basophils Relative: 0 % (ref 0–1)
Eosinophils Absolute: 0.1 10*3/uL (ref 0.0–0.7)
Eosinophils Relative: 3 % (ref 0–5)
HCT: 29.3 % — ABNORMAL LOW (ref 39.0–52.0)
Hemoglobin: 9.2 g/dL — ABNORMAL LOW (ref 13.0–17.0)
Lymphocytes Relative: 21 % (ref 12–46)
Lymphs Abs: 1.1 10*3/uL (ref 0.7–4.0)
MCH: 27.2 pg (ref 26.0–34.0)
MCHC: 31.4 g/dL (ref 30.0–36.0)
MCV: 86.7 fL (ref 78.0–100.0)
Monocytes Absolute: 0.4 10*3/uL (ref 0.1–1.0)
Monocytes Relative: 8 % (ref 3–12)
Neutro Abs: 3.5 10*3/uL (ref 1.7–7.7)
Neutrophils Relative %: 68 % (ref 43–77)
Platelets: 300 10*3/uL (ref 150–400)
RBC: 3.38 MIL/uL — ABNORMAL LOW (ref 4.22–5.81)
RDW: 14.9 % (ref 11.5–15.5)
WBC: 5.2 10*3/uL (ref 4.0–10.5)

## 2013-07-11 LAB — BASIC METABOLIC PANEL
BUN: 70 mg/dL — ABNORMAL HIGH (ref 6–23)
CO2: 33 mEq/L — ABNORMAL HIGH (ref 19–32)
Calcium: 9.4 mg/dL (ref 8.4–10.5)
Chloride: 88 mEq/L — ABNORMAL LOW (ref 96–112)
Creatinine, Ser: 2.78 mg/dL — ABNORMAL HIGH (ref 0.50–1.35)
GFR calc Af Amer: 23 mL/min — ABNORMAL LOW (ref >90)
GFR calc non Af Amer: 20 mL/min — ABNORMAL LOW (ref >90)
Glucose, Bld: 170 mg/dL — ABNORMAL HIGH (ref 70–99)
Potassium: 3.6 mEq/L — ABNORMAL LOW (ref 3.7–5.3)
Sodium: 137 mEq/L (ref 137–147)

## 2013-07-11 LAB — TROPONIN I
TROPONIN I: 0.34 ng/mL — AB (ref <0.30)
Troponin I: 0.45 ng/mL (ref <0.30)

## 2013-07-11 LAB — PRO B NATRIURETIC PEPTIDE: Pro B Natriuretic peptide (BNP): 30352 pg/mL — ABNORMAL HIGH (ref 0–450)

## 2013-07-11 LAB — GLUCOSE, CAPILLARY
GLUCOSE-CAPILLARY: 243 mg/dL — AB (ref 70–99)
Glucose-Capillary: 126 mg/dL — ABNORMAL HIGH (ref 70–99)

## 2013-07-11 MED ORDER — INSULIN DETEMIR 100 UNIT/ML ~~LOC~~ SOLN
5.0000 [IU] | Freq: Every day | SUBCUTANEOUS | Status: DC
  Administered 2013-07-11: 5 [IU] via SUBCUTANEOUS
  Filled 2013-07-11 (×3): qty 0.05

## 2013-07-11 MED ORDER — SENNOSIDES-DOCUSATE SODIUM 8.6-50 MG PO TABS
1.0000 | ORAL_TABLET | Freq: Every evening | ORAL | Status: DC | PRN
Start: 2013-07-11 — End: 2013-07-17
  Filled 2013-07-11: qty 1

## 2013-07-11 MED ORDER — ACETAMINOPHEN 650 MG RE SUPP: 650.0000 mg | Freq: Four times a day (QID) | RECTAL | Status: DC | PRN

## 2013-07-11 MED ORDER — SIMVASTATIN 10 MG PO TABS
10.0000 mg | ORAL_TABLET | Freq: Every day | ORAL | Status: DC
  Administered 2013-07-11 – 2013-07-16 (×6): 10 mg via ORAL
  Filled 2013-07-11 (×8): qty 1

## 2013-07-11 MED ORDER — INSULIN ASPART 100 UNIT/ML ~~LOC~~ SOLN
0.0000 [IU] | Freq: Every day | SUBCUTANEOUS | Status: DC
  Administered 2013-07-11: 3 [IU] via SUBCUTANEOUS

## 2013-07-11 MED ORDER — POTASSIUM CHLORIDE CRYS ER 10 MEQ PO TBCR
10.0000 meq | EXTENDED_RELEASE_TABLET | Freq: Two times a day (BID) | ORAL | Status: DC
  Administered 2013-07-11 – 2013-07-12 (×3): 10 meq via ORAL
  Filled 2013-07-11 (×5): qty 1

## 2013-07-11 MED ORDER — ONDANSETRON HCL 4 MG PO TABS
4.0000 mg | ORAL_TABLET | Freq: Four times a day (QID) | ORAL | Status: DC | PRN
  Administered 2013-07-13: 4 mg via ORAL
  Filled 2013-07-11: qty 1

## 2013-07-11 MED ORDER — HEPARIN SODIUM (PORCINE) 5000 UNIT/ML IJ SOLN
5000.0000 [IU] | Freq: Three times a day (TID) | INTRAMUSCULAR | Status: DC
  Administered 2013-07-11 – 2013-07-12 (×3): 5000 [IU] via SUBCUTANEOUS
  Filled 2013-07-11 (×11): qty 1

## 2013-07-11 MED ORDER — LEVOTHYROXINE SODIUM 100 MCG PO TABS
100.0000 ug | ORAL_TABLET | Freq: Every day | ORAL | Status: DC
  Administered 2013-07-12 – 2013-07-18 (×6): 100 ug via ORAL
  Filled 2013-07-11 (×8): qty 1

## 2013-07-11 MED ORDER — ONDANSETRON HCL 4 MG/2ML IJ SOLN
4.0000 mg | Freq: Four times a day (QID) | INTRAMUSCULAR | Status: DC | PRN
  Administered 2013-07-12 – 2013-07-15 (×5): 4 mg via INTRAVENOUS
  Filled 2013-07-11 (×3): qty 2

## 2013-07-11 MED ORDER — ACETAMINOPHEN 325 MG PO TABS
650.0000 mg | ORAL_TABLET | Freq: Four times a day (QID) | ORAL | Status: DC | PRN
  Administered 2013-07-11: 650 mg via ORAL
  Filled 2013-07-11: qty 2

## 2013-07-11 MED ORDER — INSULIN ASPART 100 UNIT/ML ~~LOC~~ SOLN
0.0000 [IU] | Freq: Three times a day (TID) | SUBCUTANEOUS | Status: DC
  Administered 2013-07-12: 5 [IU] via SUBCUTANEOUS
  Administered 2013-07-12: 4.5 [IU] via SUBCUTANEOUS

## 2013-07-11 MED ORDER — CARVEDILOL 3.125 MG PO TABS
3.1250 mg | ORAL_TABLET | Freq: Every evening | ORAL | Status: DC
  Administered 2013-07-11 – 2013-07-13 (×3): 3.125 mg via ORAL
  Filled 2013-07-11 (×4): qty 1

## 2013-07-11 MED ORDER — TORSEMIDE 20 MG PO TABS
20.0000 mg | ORAL_TABLET | Freq: Two times a day (BID) | ORAL | Status: DC
  Filled 2013-07-11 (×3): qty 1

## 2013-07-11 MED ORDER — MIDODRINE HCL 5 MG PO TABS
10.0000 mg | ORAL_TABLET | Freq: Three times a day (TID) | ORAL | Status: DC
  Administered 2013-07-12 – 2013-07-15 (×8): 10 mg via ORAL
  Filled 2013-07-11 (×13): qty 2

## 2013-07-11 MED ORDER — SODIUM CHLORIDE 0.9 % IJ SOLN
3.0000 mL | Freq: Two times a day (BID) | INTRAMUSCULAR | Status: DC
Start: 2013-07-11 — End: 2013-07-18
  Administered 2013-07-11 – 2013-07-17 (×7): 3 mL via INTRAVENOUS

## 2013-07-11 MED ORDER — ASPIRIN 81 MG PO CHEW
324.0000 mg | CHEWABLE_TABLET | Freq: Once | ORAL | Status: AC
  Administered 2013-07-11: 324 mg via ORAL
  Filled 2013-07-11: qty 4

## 2013-07-11 MED ORDER — PANTOPRAZOLE SODIUM 40 MG PO TBEC
40.0000 mg | DELAYED_RELEASE_TABLET | Freq: Two times a day (BID) | ORAL | Status: DC
  Administered 2013-07-12 – 2013-07-16 (×9): 40 mg via ORAL
  Filled 2013-07-11 (×8): qty 1

## 2013-07-11 NOTE — Telephone Encounter (Signed)
Pt's wife called the answering service reporting that the patient has had 4 episodes in the past 4 days of what she thinks are seizures. Episodes consist of whole body stiffening, teeth clenching, unresponsiveness, eyes rolling in the back of the head and occasional incontinence lasting for several seconds and remitting spontaneously. He carries no prior h/o seizure disorder per his wife. She wondered if midodrine carried this side effect- advised that rigors are common on midodrine, but not seizures. She also notes that his weight has been trending up. Advised to present to the nearest ED for a formal neuro work-up. She expressed a preference for [facility] or [facility], and would like to call EMS to arrange transport. Advised this would be fine. She understood, agreed and was appreciative.   Jacqulyn Bath, PA-C 07/11/2013 10:02 AM

## 2013-07-11 NOTE — ED Provider Notes (Signed)
CSN: [id number]     Arrival date & time 07/11/13  1416 History   First MD Initiated Contact with Patient 07/11/13 1501     Chief Complaint  Patient presents with  . Seizures  . Hypotension     (Consider location/radiation/quality/duration/timing/severity/associated sxs/prior Treatment) HPI  83yM with episodes of unresponsiveness. Recent history significant in that this past December he was admitted to the hospital with nausea/vomting. He had elevated cardiac enzymes and anemic.Complicated hiospital course including acute respiratory failure, found to have decompensated cardiac function with ejection fraction 25-35%, changed from normal two months prior. He was diuresed for fluid overload, pleural effusions, BiPAP for a short period of time. Myoview showing mid to distal anterior and anteroseptal, mid to distal inferior scar with no regions of ischemia. Has been making slow steady progress since then. Discharged on oxygen, now off. Was about to start cardiac rehab.   Since late last night pt has had several episodes where he becomes unresponsive. Happens after laying sitting for awhile and then gets up. Feels dizzy as if he may pass out. Sometimes nauseated. Appears "grey" to wife. Becomes unresponsive and "eyes roll back in head." Body will get stiff. Entire episodes lasting less than a minute. Alert and asking what happened very quickly after. Denies preceding pain. No hx of seizure.  Has been dealing with hypotension since recent illness. On coreg, albeit low dose. On midodrine because of hypotension. Despite meds recently adjusted (coreg down, midodrine up), becoming more symptomatic.   No longer or eliquis or other blood thinners. Denies recent BRBPR or melena. No fever. Has gained 7-8 lbs in past week and reports increasing LE edema. Reports compliance with medications.   PCP: Dr Judie Petit. [place] Cardiologist: Dr. Concha Se    Past Medical History  Diagnosis Date  . Hypertension   .  Diabetes mellitus without complication   . Thyroid disease   . History of GI bleed   . Diabetic retinopathy   . Diabetic peripheral neuropathy   . A-fib 2014  . Anemia   . Iron deficiency   . Pneumonia and influenza 2011  . Tetanus   . MI (myocardial infarction) 2014   Past Surgical History  Procedure Laterality Date  . Back surgery    . Vitrectomy      bilateral  . Foot surgery      bilateral  . Fetal blood transfusion      GI  . Colonoscopy     Family History  Problem Relation Age of Onset  . Heart attack Father 98  . Heart attack Brother 86  . Heart attack Paternal Grandfather    History  Substance Use Topics  . Smoking status: Never Smoker   . Smokeless tobacco: Not on file  . Alcohol Use: No    Review of Systems  All systems reviewed and negative, other than as noted in HPI.   Allergies  Review of patient's allergies indicates no known allergies.  Home Medications   Current Outpatient Rx  Name  Route  Sig  Dispense  Refill  . carvedilol (COREG) 3.125 MG tablet   Oral   Take 3.125 mg by mouth 2 (two) times daily with a meal.          . diltiazem (CARDIZEM) 30 MG tablet   Oral   Take 30 mg by mouth as needed.         Marland Kitchen KLOR-CON M10 10 MEQ tablet   Oral   Take 10 mEq by mouth once.          Marland Kitchen  levothyroxine (SYNTHROID, LEVOTHROID) 100 MCG tablet   Oral   Take 100 mcg by mouth daily before breakfast.         . lovastatin (MEVACOR) 20 MG tablet   Oral   Take 20 mg by mouth at bedtime.         . midodrine (PROAMATINE) 5 MG tablet   Oral   Take 1 tablet (5 mg total) by mouth 3 (three) times daily as needed.   90 tablet   3   . omeprazole (PRILOSEC) 20 MG capsule   Oral   Take 1 capsule (20 mg total) by mouth daily.   30 capsule   11   . torsemide (DEMADEX) 20 MG tablet   Oral   Take 20 mg by mouth once.           BP 122/67  Pulse 77  Temp(Src) 97.7 F (36.5 C) (Oral)  Resp 18  Ht 5\' 8"  (1.727 m)  Wt 184 lb (83.462 kg)   BMI 27.98 kg/m2  SpO2 99% Physical Exam  Nursing note and vitals reviewed. Constitutional: He is oriented to person, place, and time. He appears well-developed and well-nourished. No distress.  HENT:  Head: Normocephalic and atraumatic.  Eyes: Conjunctivae and EOM are normal. Pupils are equal, round, and reactive to light. Right eye exhibits no discharge. Left eye exhibits no discharge.  Neck: Neck supple.  Cardiovascular: Normal rate, regular rhythm and normal heart sounds.  Exam reveals no gallop and no friction rub.   No murmur heard. Pulmonary/Chest: Effort normal and breath sounds normal. No respiratory distress.  Abdominal: Soft. He exhibits no distension. There is no tenderness.  Musculoskeletal: He exhibits no edema and no tenderness.  Pitting LE edema, R>L. No calf tenderness. Neg Homan's.   Neurological: He is alert and oriented to person, place, and time. No cranial nerve deficit. He exhibits normal muscle tone. Coordination normal.  Skin: Skin is warm and dry.  Psychiatric: He has a normal mood and affect. His behavior is normal. Thought content normal.    ED Course  Procedures (including critical care time) Labs Review Labs Reviewed  CBC WITH DIFFERENTIAL - Abnormal; Notable for the following:    RBC 3.38 (*)    Hemoglobin 9.2 (*)    HCT 29.3 (*)    All other components within normal limits  TROPONIN I - Abnormal; Notable for the following:    Troponin I 0.45 (*)    All other components within normal limits  BASIC METABOLIC PANEL - Abnormal; Notable for the following:    Potassium 3.6 (*)    Chloride 88 (*)    CO2 33 (*)    Glucose, Bld 170 (*)    BUN 70 (*)    Creatinine, Ser 2.78 (*)    GFR calc non Af Amer 20 (*)    GFR calc Af Amer 23 (*)    All other components within normal limits  PRO B NATRIURETIC PEPTIDE - Abnormal; Notable for the following:    Pro B Natriuretic peptide (BNP) 30352.0 (*)    All other components within normal limits  TROPONIN I -  Abnormal; Notable for the following:    Troponin I 0.34 (*)    All other components within normal limits  TROPONIN I - Abnormal; Notable for the following:    Troponin I 0.36 (*)    All other components within normal limits  TROPONIN I - Abnormal; Notable for the following:    Troponin I 0.33 (*)  All other components within normal limits  VITAMIN B12 - Abnormal; Notable for the following:    Vitamin B-12 983 (*)    All other components within normal limits  IRON AND TIBC - Abnormal; Notable for the following:    Iron 16 (*)    Saturation Ratios 4 (*)    All other components within normal limits  FERRITIN - Abnormal; Notable for the following:    Ferritin 18 (*)    All other components within normal limits  GLUCOSE, CAPILLARY - Abnormal; Notable for the following:    Glucose-Capillary 126 (*)    All other components within normal limits  GLUCOSE, CAPILLARY - Abnormal; Notable for the following:    Glucose-Capillary 243 (*)    All other components within normal limits  GLUCOSE, CAPILLARY - Abnormal; Notable for the following:    Glucose-Capillary 257 (*)    All other components within normal limits  BASIC METABOLIC PANEL - Abnormal; Notable for the following:    Sodium 135 (*)    Chloride 88 (*)    Glucose, Bld 255 (*)    BUN 75 (*)    Creatinine, Ser 2.75 (*)    GFR calc non Af Amer 20 (*)    GFR calc Af Amer 23 (*)    All other components within normal limits  GLUCOSE, CAPILLARY - Abnormal; Notable for the following:    Glucose-Capillary 188 (*)    All other components within normal limits  GLUCOSE, CAPILLARY - Abnormal; Notable for the following:    Glucose-Capillary 65 (*)    All other components within normal limits  BASIC METABOLIC PANEL - Abnormal; Notable for the following:    Potassium 6.1 (*)    Chloride 89 (*)    CO2 34 (*)    Glucose, Bld 283 (*)    BUN 80 (*)    Creatinine, Ser 3.27 (*)    GFR calc non Af Amer 16 (*)    GFR calc Af Amer 19 (*)    All  other components within normal limits  HEMOGLOBIN A1C - Abnormal; Notable for the following:    Hemoglobin A1C 7.4 (*)    Mean Plasma Glucose 166 (*)    All other components within normal limits  CBC - Abnormal; Notable for the following:    RBC 3.13 (*)    Hemoglobin 8.2 (*)    HCT 26.7 (*)    All other components within normal limits  GLUCOSE, CAPILLARY - Abnormal; Notable for the following:    Glucose-Capillary 58 (*)    All other components within normal limits  GLUCOSE, CAPILLARY - Abnormal; Notable for the following:    Glucose-Capillary 66 (*)    All other components within normal limits  GLUCOSE, CAPILLARY - Abnormal; Notable for the following:    Glucose-Capillary 52 (*)    All other components within normal limits  GLUCOSE, CAPILLARY - Abnormal; Notable for the following:    Glucose-Capillary 62 (*)    All other components within normal limits  GLUCOSE, CAPILLARY - Abnormal; Notable for the following:    Glucose-Capillary 208 (*)    All other components within normal limits  GLUCOSE, CAPILLARY - Abnormal; Notable for the following:    Glucose-Capillary 297 (*)    All other components within normal limits  BASIC METABOLIC PANEL - Abnormal; Notable for the following:    Chloride 89 (*)    Glucose, Bld 256 (*)    BUN 78 (*)    Creatinine, Ser 3.13 (*)  GFR calc non Af Amer 17 (*)    GFR calc Af Amer 20 (*)    All other components within normal limits  GLUCOSE, CAPILLARY - Abnormal; Notable for the following:    Glucose-Capillary 151 (*)    All other components within normal limits  BASIC METABOLIC PANEL - Abnormal; Notable for the following:    Chloride 93 (*)    Glucose, Bld 162 (*)    BUN 76 (*)    Creatinine, Ser 2.95 (*)    GFR calc non Af Amer 19 (*)    GFR calc Af Amer 21 (*)    All other components within normal limits  CBC - Abnormal; Notable for the following:    RBC 3.05 (*)    Hemoglobin 8.1 (*)    HCT 26.3 (*)    All other components within  normal limits  GLUCOSE, CAPILLARY - Abnormal; Notable for the following:    Glucose-Capillary 35 (*)    All other components within normal limits  GLUCOSE, CAPILLARY - Abnormal; Notable for the following:    Glucose-Capillary 169 (*)    All other components within normal limits  GLUCOSE, CAPILLARY - Abnormal; Notable for the following:    Glucose-Capillary 165 (*)    All other components within normal limits  GLUCOSE, CAPILLARY - Abnormal; Notable for the following:    Glucose-Capillary 124 (*)    All other components within normal limits  PRO B NATRIURETIC PEPTIDE - Abnormal; Notable for the following:    Pro B Natriuretic peptide (BNP) 16662.0 (*)    All other components within normal limits  GLUCOSE, CAPILLARY - Abnormal; Notable for the following:    Glucose-Capillary 146 (*)    All other components within normal limits  GLUCOSE, CAPILLARY - Abnormal; Notable for the following:    Glucose-Capillary 118 (*)    All other components within normal limits  BASIC METABOLIC PANEL - Abnormal; Notable for the following:    Chloride 93 (*)    Glucose, Bld 138 (*)    BUN 69 (*)    Creatinine, Ser 2.64 (*)    GFR calc non Af Amer 21 (*)    GFR calc Af Amer 25 (*)    All other components within normal limits  GLUCOSE, CAPILLARY - Abnormal; Notable for the following:    Glucose-Capillary 138 (*)    All other components within normal limits  GLUCOSE, CAPILLARY - Abnormal; Notable for the following:    Glucose-Capillary 198 (*)    All other components within normal limits  POCT I-STAT 3, BLOOD GAS (G3P V) - Abnormal; Notable for the following:    pH, Ven 7.394 (*)    pCO2, Ven 57.4 (*)    pO2, Ven 22.0 (*)    Bicarbonate 35.0 (*)    Acid-Base Excess 9.0 (*)    All other components within normal limits  POCT I-STAT 3, BLOOD GAS (G3P V) - Abnormal; Notable for the following:    pH, Ven 7.402 (*)    pCO2, Ven 56.0 (*)    pO2, Ven 23.0 (*)    Bicarbonate 34.8 (*)    Acid-Base Excess  9.0 (*)    All other components within normal limits  MRSA PCR SCREENING  TSH  FOLATE  GLUCOSE, CAPILLARY  GLUCOSE, CAPILLARY  GLUCOSE, CAPILLARY  PROTIME-INR  BASIC METABOLIC PANEL  CARBOXYHEMOGLOBIN   Imaging Review No results found.  Dg Chest 2 View  07/12/2013   CLINICAL DATA:  Chest pain and shortness of breath  EXAM: CHEST  2 VIEW  COMPARISON:  06/16/2012  FINDINGS: Cardiac shadow is stable but mildly enlarged. Patchy infiltrative changes are noted in the mid lungs bilaterally. Portion of this may be chronic in nature as similar infiltrates are noted on the previous exam. No sizable effusion is seen. The bony structures are within normal limits.  IMPRESSION: Bilateral patchy infiltrates. A portion of this may be chronic in nature. Followup films are recommended.   Electronically Signed   By: Alcide Clever M.D.   On: 07/12/2013 09:41    EKG Interpretation   Date/Time:  Saturday July 11 2013 14:53:42 EDT Ventricular Rate:  77 PR Interval:  200 QRS Duration: 94 QT Interval:  415 QTC Calculation: 470 R Axis:   -45 Text Interpretation:  Sinus rhythm Inferior infarct, old Anterior infarct,  old Non-specific ST-t changes Confirmed by Juleen China  MD, Ilani Otterson ([id number]) on  07/11/2013 3:59:25 PM      MDM   Final diagnoses:  Syncope  AKI (acute kidney injury)  Orthostasis  Elevated troponin    83yM with brief episodes of unresponsiveness and shaking. Almost certainly syncope sometimes with syncopal convulsions. I do not think he is having seizures. Neuro exam. Consistently happening with changes in position from laying to standing. Prodrome of dizziness and sometimes nausea. Appears "grey." Periods of unresponsiveness very brief and very quick return or baseline. Pt with recorded BPs over the last week with several readings 80s/40s and some 70/40s. Symptomatically worsening despite decreasing coreg which was already low dose and increasing midodrine.   W/u significant for worsening  renal function. Creatinine on discharge is 04/20/13 was 1.44.   5:43 PM Discussed with cardiology, will see in consultation.   Raeford Razor, MD 07/14/13 2228

## 2013-07-11 NOTE — H&P (Addendum)
[facility] History and Physical  Joseph Burgess UMP:[id number] DOB: 25-Jan-1930 DOA: 07/11/2013  Referring physician: Juleen Burgess PCP: Joseph Regulus., MD   Chief Complaint: unresponsive episodes  HPI: Joseph Burgess is a 80 y.o. male  Came to ED with multiple syncopal/near syncopal spells over the past few days.  Has h/o orthostatic hypotension on midodrine, systolic CHF, CAD, CKD, insulin dependent DM on insulin pod, which is a tubeless pump.  Spells occur when standing. Last a few seconds. Patient never loses consiousness, no postictal period.  Wife was concerned about seizures, due to stiffening and teeth clenching.  No h/o seizures. Followed by Joseph Burgess for cardiac problems, also managing his orthostatic hypotension.  Denies chest pain, change in chronic intermittent dyspnea.  Palpitations. In ED, creatinine is 2.8  (baseline 1.7 per office notes) troponin 0.45, hgb 9.2 (baseline unknown). Blood pressure not low, EKG showing no acute changes.  [facility] consulted by EDP  Also, wife has noted increase in weight of 8 lbs over a week. Patient stopped taking evening torsemide a few days ago on his own.  Review of Systems:  Systems reviewed. As above otherwise negative  Past Medical History  Diagnosis Date  . Hypertension   . Diabetes mellitus without complication   . Thyroid disease   . History of GI bleed   . Diabetic retinopathy   . Diabetic peripheral neuropathy   . A-fib 2014  . Anemia   . Iron deficiency   . Pneumonia and influenza 2011  . Tetanus   . MI (myocardial infarction) 2014   Past Surgical History  Procedure Laterality Date  . Back surgery    . Vitrectomy      bilateral  . Foot surgery      bilateral  . Fetal blood transfusion      GI  . Colonoscopy     Social History:  reports that he has never smoked. He does not have any smokeless tobacco history on file. He reports that he does not drink alcohol or use illicit drugs.  No Known Allergies  Family  History  Problem Relation Age of Onset  . Heart attack Father 45  . Heart attack Brother 41  . Heart attack Paternal Grandfather      Prior to Admission medications   Medication Sig Start Date End Date Taking? Authorizing Provider  carvedilol (COREG) 3.125 MG tablet Take 3.125 mg by mouth every evening.    Yes Historical Provider, MD  Insulin Disposable Pump (OMNIPOD) MISC by Continuous infusion (non-IV) route as needed ("Humalog" insulin).    Yes Historical Provider, MD  levothyroxine (SYNTHROID, LEVOTHROID) 100 MCG tablet Take 100 mcg by mouth daily before breakfast.   Yes Historical Provider, MD  lovastatin (MEVACOR) 20 MG tablet Take 20 mg by mouth at bedtime.   Yes Historical Provider, MD  midodrine (PROAMATINE) 5 MG tablet Take 10 mg by mouth bid   Yes Historical Provider, MD  pantoprazole (PROTONIX) 40 MG tablet Take 40 mg by mouth 2 (two) times daily before a meal.   Yes Historical Provider, MD  potassium chloride (K-DUR,KLOR-CON) 10 MEQ tablet Take 10 mEq by mouth daily at 12 noon.   Yes Historical Provider, MD  torsemide (DEMADEX) 20 MG tablet Take 20 mg by mouth 2 (two) times daily.   Yes Historical Provider, MD   Physical Exam: Filed Vitals:   07/11/13 1730  BP: 120/66  Pulse: 73  Temp:   Resp: 19    BP 120/66  Pulse 73  Temp(Src) 97.7 F (36.5 C) (Oral)  Resp 19  Ht 5\' 8"  (1.727 m)  Wt 83.462 kg (184 lb)  BMI 27.98 kg/m2  SpO2 99% BP 112/63  Pulse 78  Temp(Src) 97.7 F (36.5 C) (Oral)  Resp 16  Ht 5\' 8"  (1.727 m)  Wt 79.606 kg (175 lb 8 oz)  BMI 26.69 kg/m2  SpO2 100%  General Appearance:    Alert, cooperative, no distress, appears stated age  Head:    Normocephalic, without obvious abnormality, atraumatic  Eyes:    PERRL, conjunctiva/corneas clear, EOM's intact, fundi    benign, both eyes          Nose:   Nares normal, septum midline, mucosa normal, no drainage   or sinus tenderness  Throat:   Lips, mucosa, and tongue normal; teeth and gums normal    Neck:   Supple, no carotid bruit, lymphadenopathy or thyromegaly  Back:     Symmetric, no curvature, ROM normal, no CVA tenderness  Lungs:     Clear to auscultation bilaterally, respirations unlabored  Chest wall:    No tenderness or deformity  Heart:    Regular rate and rhythm, S1 and S2 normal, no murmur, rub   or gallop  Abdomen:     Soft, non-tender, bowel sounds active all four quadrants,    no masses, no organomegaly  Genitalia:    deferred  Rectal:    deferred  Extremities:   1+ edema  Pulses:   2+   Skin:   Skin color, texture, turgor normal, no rashes or lesions  Lymph nodes:   Cervical, supraclavicular, and axillary nodes normal  Neurologic:   CNII-XII intact. Normal strength, sensation and reflexes      throughout    Psych: normal affect        Labs on Admission:  Basic Metabolic Panel:  Recent Labs Lab 07/11/13 1445  NA 137  K 3.6*  CL 88*  CO2 33*  GLUCOSE 170*  BUN 70*  CREATININE 2.78*  CALCIUM 9.4   Liver Function Tests: No results found for this basename: AST, ALT, ALKPHOS, BILITOT, PROT, ALBUMIN,  in the last 168 hours No results found for this basename: LIPASE, AMYLASE,  in the last 168 hours No results found for this basename: AMMONIA,  in the last 168 hours CBC:  Recent Labs Lab 07/11/13 1445  WBC 5.2  NEUTROABS 3.5  HGB 9.2*  HCT 29.3*  MCV 86.7  PLT 300   Cardiac Enzymes:  Recent Labs Lab 07/11/13 1445  TROPONINI 0.45*    BNP (last 3 results) No results found for this basename: PROBNP,  in the last 8760 hours CBG: No results found for this basename: GLUCAP,  in the last 168 hours  Radiological Exams on Admission: No results found.  EKG: Sinus rhythm Inferior infarct, old Anterior infarct, old  Assessment/Plan Principal Problem:   Syncope: likely from orthostatic hypotension. Doubt seizures.  Ted hose. Change midodrine to tid. (has been taking bid). Hesitant to give IVF, given h/o CHF and recent 8 lb weight gain. Monitor  on telemetry Active Problems:   Elevated troponin: no chest pain, and EKG ok. Could be from renal failure and CHF. Will cycle. EDMD consulted [facility]  P A-fib: not on anticoagulation due to GIB   Diabetes type 1, controlled: did not bring omnipod. Will give levemir and SSI for now. Consult DM educator. Wife will bring pod tomorrow.   Orthostatic hypotension   CAD (coronary artery disease)  Ischemic cardiomyopathy: EF 25 - 35%   Acute renal failure: hold on IVF due to CHF, weight gain   CKD (chronic kidney disease) stage 3, GFR 30-59 ml/min   Chronic systolic heart failure: check proBNP.   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 Check anemia panel and hemoccult  Code Status: full Family Communication: wife at bedside Disposition Plan: home  Time spent: 60 minutes  Ozan Maclay L [facility] Pager [phone]

## 2013-07-11 NOTE — Consult Note (Signed)
Cardiology Consultation Note  Patient ID: Joseph Burgess, MRN: [id number], DOB/AGE: 78/20/1931 78 y.o. Admit date: 07/11/2013   Date of Consult: 07/11/2013 Primary Physician: Joseph Burgess., MD Primary Cardiologist: Joseph Nordmann, MD  Chief Complaint: Syncope Reason for Consult: Troponin elevation  HPI: Joseph Burgess is an 80-y/o M with history of atrial fibrillation, hypertension, type 1 DM, and recurrent syncope/near syncope, whom we have been asked to see by the hospitalist service to assist with further evaluation of mild troponin elevation in the setting of recurrent syncope.  The patient reports frequent near-syncopal episodes that began after he was hospitalized with a GI bleed complicated by NSTEMI and decompensated HF with LVEF 25-35% in 03/2013.  He underwent myocardial perfusion stress test that revealed anterior and inferior fixed defects without ischemia.  Due to renal insufficiency, coronary angiography was not pursued.  The patient is no longer on Eliquis due to the aforementioned Gi bleeding.  He was recently started on a low-dose aspirin every other day without obvious bleeding.  Since then, the patient reports feeling lightheaded at least 2-3 times per day.  This typically occurs shortly after standing and lasts anywhere from 30 seconds to 2-3 minutes.  The patient denies every passing out completely, though he has fallen on at least one occasion.  His most recent episode occurred on the night prior to admission when he stood up to go to bed.  The patient has been diagnosed with orthostatic hypotension, for which he has been taking midodrine.  The patient notes that he recently decreased his torsemide from 20 mg BID to once daily.  He reports increased leg swelling over the last few days with an associated 4 pound weight gain over the last week.  He denies chest pain and palpitations.  He has stable exertional dyspnea.   Past Medical History  Diagnosis Date  . Hypertension   .  Diabetes mellitus without complication   . Thyroid disease   . History of GI bleed   . Diabetic retinopathy   . Diabetic peripheral neuropathy   . A-fib 2014  . Anemia   . Iron deficiency   . Pneumonia and influenza 2011  . Tetanus   . MI (myocardial infarction) 2014      Most Recent Cardiac Studies: TTE (04/08/13): LVEF 25-35% with mild LVH and apical/septal wall-motion abnormality.  Mild left atrial dilation with large left pleural effusion.  Myocardial perfusion stress test (04/16/13): Fixed defect involving the mid and apical anteroseptal segments, apical segment, and mid and apical inferolateral segments.  LVEF 35%.   Surgical History:  Past Surgical History  Procedure Laterality Date  . Back surgery    . Vitrectomy      bilateral  . Foot surgery      bilateral  . Fetal blood transfusion      GI  . Colonoscopy       Home Meds: Prior to Admission medications   Medication Sig Start Date Joseph Burgess Date Taking? Authorizing Provider  carvedilol (COREG) 3.125 MG tablet Take 3.125 mg by mouth every evening.    Yes Historical Provider, MD  Insulin Disposable Pump (OMNIPOD) MISC by Continuous infusion (non-IV) route as needed ("Humalog" insulin).    Yes Historical Provider, MD  levothyroxine (SYNTHROID, LEVOTHROID) 100 MCG tablet Take 100 mcg by mouth daily before breakfast.   Yes Historical Provider, MD  lovastatin (MEVACOR) 20 MG tablet Take 20 mg by mouth at bedtime.   Yes Historical Provider, MD  midodrine (PROAMATINE) 5 MG tablet Take 10  mg by mouth every morning.   Yes Historical Provider, MD  pantoprazole (PROTONIX) 40 MG tablet Take 40 mg by mouth 2 (two) times daily before a meal.   Yes Historical Provider, MD  potassium chloride (K-DUR,KLOR-CON) 10 MEQ tablet Take 10 mEq by mouth daily at 12 noon.   Yes Historical Provider, MD  torsemide (DEMADEX) 20 MG tablet Take 20 mg by mouth 2 (two) times daily.   Yes Historical Provider, MD    Inpatient Medications:  . carvedilol   3.125 mg Oral QPM  . heparin  5,000 Units Subcutaneous 3 times per day  . insulin aspart  0-5 Units Subcutaneous QHS  . [START ON 07/12/2013] insulin aspart  0-9 Units Subcutaneous TID WC  . insulin detemir  5 Units Subcutaneous QHS  . [START ON 07/12/2013] levothyroxine  100 mcg Oral QAC breakfast  . [START ON 07/12/2013] midodrine  10 mg Oral TID WC  . [START ON 07/12/2013] pantoprazole  40 mg Oral BID AC  . potassium chloride  10 mEq Oral BID  . simvastatin  10 mg Oral QHS  . sodium chloride  3 mL Intravenous Q12H  . [START ON 07/12/2013] torsemide  20 mg Oral BID      Allergies: No Known Allergies  History   Social History  . Marital Status: Married    Spouse Name: N/A    Number of Children: N/A  . Years of Education: N/A   Occupational History  . Not on file.   Social History Main Topics  . Smoking status: Never Smoker   . Smokeless tobacco: Not on file  . Alcohol Use: No  . Drug Use: No  . Sexual Activity: Not on file   Other Topics Concern  . Not on file   Social History Narrative  . No narrative on file     Family History  Problem Relation Age of Onset  . Heart attack Father 284  . Heart attack Brother 2762  . Heart attack Paternal Grandfather      Review of Systems: A 12-system review of systems was performed and was negative except as noted in the HPI.  Labs:  Recent Labs  07/11/13 1445 07/11/13 2055  TROPONINI 0.45* 0.34*   Lab Results  Component Value Date   WBC 5.2 07/11/2013   HGB 9.2* 07/11/2013   HCT 29.3* 07/11/2013   MCV 86.7 07/11/2013   PLT 300 07/11/2013    Recent Labs Lab 07/11/13 1445  NA 137  K 3.6*  CL 88*  CO2 33*  BUN 70*  CREATININE 2.78*  CALCIUM 9.4  GLUCOSE 170*    Radiology/Studies:  None.  EKG: NSR with inferior Q-waves, poor R-wave progression, and non-specific ST-segment changes in the lateral leads.  Physical Exam: Blood pressure 106/57, pulse 78, temperature 97.7 F (36.5 C), temperature source Oral, resp.  rate 16, height 5\' 8"  (1.727 m), weight 79.606 kg (175 lb 8 oz), SpO2 100.00%. General: Well developed, well nourished, in no acute distress. Head: Normocephalic, atraumatic, sclera non-icteric, no xanthomas, nares are without discharge.  Neck: Negative for carotid bruits. JVP to earlobe at 45% degrees with positive HJR. Lungs: Clear bilaterally to auscultation without wheezes, rales, or rhonchi. Breathing is unlabored. Heart: RRR with S1 S2. No murmurs, rubs, or gallops appreciated. Abdomen: Soft, non-tender, non-distended with normoactive bowel sounds. No hepatomegaly. No rebound/guarding. No obvious abdominal masses. Msk:  Strength and tone appear normal for age. Extremities: No clubbing or cyanosis. 2+ pitting pretibial edema bilaterally.  Distal pedal  pulses are 2+ and equal bilaterally. Neuro: Alert and oriented X 3. No facial asymmetry. No focal deficit. Moves all extremities spontaneously. Psych:  Responds to questions appropriately with a normal affect.    Assessment and Plan:  80 y/o M with h/o paroxysmal atrial fibrillation, cardiomyopathy (likely ischemic) and recent NSTEMI/decompensated HF in the setting of GI bleed, who presents for further evaluation of recurrent near-syncope.  Near-syncope:  Episodes are most characteristic of orthostatic hypotension.  Given his long history of DM1, he is certainly at risk for autonomic dysfunction.  Other considerations would include vasovagal syncope (though timing of his episodes is less characteristic of this) and tachyarrhythmia, for which the patient is at risk for given his severe LV dysfunction.  - Monitor on telemetry  - Consider checking morning cortisol, SPEP, and UPEP, given orthostatic hypotension.  - At this time the patient does not appear intravascularly volume depleted based on exam; avoid aggressive hydration at this time.  - Would repeat echocardiogram; if LVEF still less than 35%, would need to consider ICD placement.  -  Otherwise, consider outpatient cardiac event monitor to further evaluate for any cardiac arrhythmia leading to the patient's near-syncope.  Cardiomyopathy:  Patient appears volume overloaded on exam today with elevated JVP and BLE edema.  ProBNP is also very high, though no baseline value is available.  - Switch torsemide to Lasix 40 mg IV BID; titrate for net negative 1-2 L in 24 hours.  - If difficulty diuresing, given renal dysfunction, may require inotropic support.  - Repeat TTE and consider ICD if LVEF < 35%  - Check serum lacate.  CAD:  Patient without symptoms to suggest acute ischemia.  Troponin noted to be minimally elevated and downtrending on repeat assay.  - Continue ASA 81 mg every other day  - Continue simvastatin  - Defer LHC, given renal dysfunction.  Acute on chronic renal insufficiency: May be due to worsening HF.  - Attempt diuresis as above.  - Defer non-cardiac w/u to primary team.  Signed, Jlee Harkless A. MD  07/11/2013, 10:23 PM

## 2013-07-11 NOTE — ED Notes (Addendum)
Family reports that pt "had a big seizure at midnight" and then tried to have 2 more this morning. Family also reports that pt has had low BP "in the 70's." Pt reports when he stands up feels like he is going to pass out. Family reports pt was taking eliquist for afib and caused internal bleeding that led to pt having a heart attack.

## 2013-07-12 ENCOUNTER — Inpatient Hospital Stay (HOSPITAL_COMMUNITY): Payer: Medicare Other

## 2013-07-12 DIAGNOSIS — R55 Syncope and collapse: Secondary | ICD-10-CM | POA: Diagnosis present

## 2013-07-12 DIAGNOSIS — E039 Hypothyroidism, unspecified: Secondary | ICD-10-CM | POA: Diagnosis present

## 2013-07-12 DIAGNOSIS — E785 Hyperlipidemia, unspecified: Secondary | ICD-10-CM | POA: Diagnosis present

## 2013-07-12 DIAGNOSIS — R799 Abnormal finding of blood chemistry, unspecified: Secondary | ICD-10-CM

## 2013-07-12 LAB — TROPONIN I
TROPONIN I: 0.36 ng/mL — AB (ref <0.30)
Troponin I: 0.33 ng/mL (ref <0.30)

## 2013-07-12 LAB — GLUCOSE, CAPILLARY
GLUCOSE-CAPILLARY: 257 mg/dL — AB (ref 70–99)
GLUCOSE-CAPILLARY: 58 mg/dL — AB (ref 70–99)
GLUCOSE-CAPILLARY: 62 mg/dL — AB (ref 70–99)
Glucose-Capillary: 188 mg/dL — ABNORMAL HIGH (ref 70–99)
Glucose-Capillary: 65 mg/dL — ABNORMAL LOW (ref 70–99)
Glucose-Capillary: 66 mg/dL — ABNORMAL LOW (ref 70–99)
Glucose-Capillary: 87 mg/dL (ref 70–99)
Glucose-Capillary: 52 mg/dL — ABNORMAL LOW (ref 70–99)

## 2013-07-12 LAB — FOLATE

## 2013-07-12 LAB — BASIC METABOLIC PANEL
BUN: 75 mg/dL — ABNORMAL HIGH (ref 6–23)
CO2: 32 mEq/L (ref 19–32)
Calcium: 9 mg/dL (ref 8.4–10.5)
Chloride: 88 mEq/L — ABNORMAL LOW (ref 96–112)
Creatinine, Ser: 2.75 mg/dL — ABNORMAL HIGH (ref 0.50–1.35)
GFR calc Af Amer: 23 mL/min — ABNORMAL LOW (ref >90)
GFR, EST NON AFRICAN AMERICAN: 20 mL/min — AB (ref >90)
GLUCOSE: 255 mg/dL — AB (ref 70–99)
Potassium: 3.8 mEq/L (ref 3.7–5.3)
Sodium: 135 mEq/L — ABNORMAL LOW (ref 137–147)

## 2013-07-12 LAB — IRON AND TIBC
Iron: 16 ug/dL — ABNORMAL LOW (ref 42–135)
Saturation Ratios: 4 % — ABNORMAL LOW (ref 20–55)
TIBC: 371 ug/dL (ref 215–435)
UIBC: 355 ug/dL (ref 125–400)

## 2013-07-12 LAB — FERRITIN: FERRITIN: 18 ng/mL — AB (ref 22–322)

## 2013-07-12 LAB — TSH: TSH: 2.227 u[IU]/mL (ref 0.350–4.500)

## 2013-07-12 LAB — VITAMIN B12: Vitamin B-12: 983 pg/mL — ABNORMAL HIGH (ref 211–911)

## 2013-07-12 MED ORDER — FUROSEMIDE 10 MG/ML IJ SOLN
40.0000 mg | Freq: Two times a day (BID) | INTRAMUSCULAR | Status: DC
  Administered 2013-07-12: 40 mg via INTRAVENOUS
  Filled 2013-07-12: qty 4

## 2013-07-12 MED ORDER — ASPIRIN EC 81 MG PO TBEC
81.0000 mg | DELAYED_RELEASE_TABLET | ORAL | Status: DC
  Administered 2013-07-13 – 2013-07-15 (×2): 81 mg via ORAL
  Filled 2013-07-12 (×3): qty 1

## 2013-07-12 MED ORDER — INSULIN PUMP
Freq: Three times a day (TID) | SUBCUTANEOUS | Status: DC
  Administered 2013-07-13 – 2013-07-14 (×6): via SUBCUTANEOUS
  Administered 2013-07-15: 1.5 via SUBCUTANEOUS
  Administered 2013-07-15 (×2): via SUBCUTANEOUS
  Administered 2013-07-15 – 2013-07-16 (×2): 1 via SUBCUTANEOUS
  Filled 2013-07-12: qty 1

## 2013-07-12 NOTE — Progress Notes (Signed)
Hypoglycemic Event  CBG: 65  Treatment: 15 GM carbohydrate snack  Symptoms: None  Follow-up CBG: Time:1745 CBG Result:58  Possible Reasons for Event: Inadequate meal intake  Comments/MD notified:Dr Elisabeth Pigeon Paged. Pt asymptomatic. Pt hadnt eaten dinner at the time of rechecking blood sugar. Pt now eating full dinner.    Joseph Burgess B  Remember to initiate Hypoglycemia Order Set & complete

## 2013-07-12 NOTE — Progress Notes (Signed)
Inpatient Diabetes Program Recommendations  AACE/ADA: New Consensus Statement on Inpatient Glycemic Control (2013)  Target Ranges:  Prepandial:   less than 140 mg/dL      Peak postprandial:   less than 180 mg/dL (1-2 hours)      Critically ill patients:  140 - 180 mg/dL   Reason for Assessment:  Call received at 10:15 am. Patient to start back insulin pump today.  He has an Omni pod and is alert and oriented to manage pump.  RN has completed insulin pump contract and CBG at lunch is 188 mg/dL.  Will assess insulin pump settings on 07/13/13.    Beryl Meager, RN, BC-ADM Inpatient Diabetes Coordinator Pager [phone]

## 2013-07-12 NOTE — Progress Notes (Signed)
Pt complaining of nausea and dry heaving. Pt given 4mg  of zofran per PRN orders. Dr Elisabeth Pigeon called and made aware. Pt states that he had this trouble last weekand increased his protonix to 80BID. Elisabeth Pigeon made aware. Pt denies CP/SOB at this time. Nausea subsided 5 minutes after zofran given. Will continue to mointor closely. Levonne Spiller, RN

## 2013-07-12 NOTE — Evaluation (Signed)
Physical Therapy Evaluation Patient Details Name: Joseph Burgess MRN: [id number] DOB: 1929/06/28 Today's Date: 07/12/2013 Time: 9753-0051 PT Time Calculation (min): 29 min  PT Assessment / Plan / Recommendation History of Present Illness  Patient is an 80 yo male who came to ED with multiple syncopal/near syncopal spells over the past few days.  Has h/o orthostatic hypotension on midodrine, systolic CHF, CAD, CKD, insulin dependent DM on insulin pod, which is a tubeless pump.  Spells occur when standing. Last a few seconds. Patient never loses consiousness, no postictal period.  Clinical Impression  Patient presents with problems listed below.  Will benefit from acute PT to maximize independence prior to discharge home with wife.  Recommended to patient that he use his RW for balance/safety until he returns to baseline.  Recommended HHPT for continued therapy at discharge.    PT Assessment  Patient needs continued PT services    Follow Up Recommendations  Home health PT;Supervision/Assistance - 24 hour    Does the patient have the potential to tolerate intense rehabilitation      Barriers to Discharge        Equipment Recommendations  None recommended by PT    Recommendations for Other Services     Frequency Min 3X/week    Precautions / Restrictions Precautions Precautions: Fall Precaution Comments: Has had a fall during episode of weakness at home. Restrictions Weight Bearing Restrictions: No   Pertinent Vitals/Pain BP in sitting 106/53.  BP in standing 112/60.  No orthostatic      Mobility  Transfers Overall transfer level: Needs assistance Equipment used: None Transfers: Sit to/from Stand Sit to Stand: Min guard General transfer comment: Patient uses safe hand placement.  Patient with decreased balance in standing, with wide BOS. Ambulation/Gait Ambulation/Gait assistance: Min assist Ambulation Distance (Feet): 66 Feet Assistive device: None Gait Pattern/deviations:  Step-through pattern;Decreased step length - right;Decreased step length - left;Steppage;Staggering left;Staggering right;Wide base of support Gait velocity: Slow Gait velocity interpretation: Below normal speed for age/gender General Gait Details: Attempted ambulation without assistive device.  Patient with wide BOS and short steps.  Noted steppage gait with feet hitting flat on floor.  Patient with decreased balance, staggering to both sides, requiring assist to maintain balance.  Fatigued quickly.  Dyspnea 2/4 with gait.    Exercises     PT Diagnosis: Abnormality of gait;Difficulty walking;Generalized weakness  PT Problem List: Decreased strength;Decreased activity tolerance;Decreased balance;Decreased mobility;Decreased knowledge of use of DME;Cardiopulmonary status limiting activity PT Treatment Interventions: DME instruction;Gait training;Stair training;Functional mobility training;Therapeutic exercise;Patient/family education     PT Goals(Current goals can be found in the care plan section) Acute Rehab PT Goals Patient Stated Goal: To go home PT Goal Formulation: With patient Time For Goal Achievement: 07/19/13 Potential to Achieve Goals: Good  Visit Information  Last PT Received On: 07/12/13 Assistance Needed: +1 History of Present Illness: Patient is an 80 yo male who came to ED with multiple syncopal/near syncopal spells over the past few days.  Has h/o orthostatic hypotension on midodrine, systolic CHF, CAD, CKD, insulin dependent DM on insulin pod, which is a tubeless pump.  Spells occur when standing. Last a few seconds. Patient never loses consiousness, no postictal period.       Prior Functioning  Home Living Family/patient expects to be discharged to:: Private residence Living Arrangements: Spouse/significant other Available Help at Discharge: Family;Available 24 hours/day Type of Home: House Home Access: Stairs to enter [employer] of Steps: 3 Entrance  Stairs-Rails: Right;Left Home Layout: One level  Home Equipment: Shower seat - built in;Walker - 2 wheels;Cane - single point;Wheelchair - manual;Bedside commode Prior Function Level of Independence: Independent Communication Communication: No difficulties    Cognition  Cognition Arousal/Alertness: Awake/alert Behavior During Therapy: WFL for tasks assessed/performed Overall Cognitive Status: Within Functional Limits for tasks assessed    Extremity/Trunk Assessment Upper Extremity Assessment Upper Extremity Assessment: Overall WFL for tasks assessed Lower Extremity Assessment Lower Extremity Assessment: Generalized weakness   Balance Balance Overall balance assessment: Needs assistance Standing balance support: No upper extremity supported Standing balance-Leahy Scale: Fair  End of Session PT - End of Session Equipment Utilized During Treatment: Gait belt Activity Tolerance: Patient limited by fatigue Patient left: in chair;with call bell/phone within reach;with nursing/sitter in room Nurse Communication: Mobility status (BP - no change from sit > stand)  GP     [name], Joseph Grosch H 07/12/2013, 6:09 PM Durenda HurtSusan H. Renaldo Burgess, PT, [facility] CenterMBA Acute Rehab Services Pager [phone]

## 2013-07-12 NOTE — Progress Notes (Addendum)
[facility] PROGRESS NOTE  Joseph Burgess EAV:[id number] DOB: 07-02-1929 DOA: 07/11/2013 PCP: Lauro RegulusANDERSON,MARSHALL W., MD  Brief narrative: 80 y.o. male with past medical history of systolic CHF, CKD stage IV, diabetes on insulin pump who [facility] with near syncope. He reported he has frequent near syncopal events and very often feel very unsteady on his feet. Pt was found to have troponin of 0.45, BNP of ~3,000, hemoglobin of 9.2, potassium of 3.6 and creatinine of 2.78. The 12 lead EKG showed no acute ischemic events. Additionally, his weight was up 8 pounds in 1 week period.  Assessment/Plan:  Principal Problem:   Weakness, near syncope - perhaps secondary to orthostatic hypotension, BP 91/50 this am - continue midodrine 10 mg PO TID - will need PT eval prior to discharge  Active Problems:   Diabetes type 1 with renal manifestations - will restart pt home insulin pump - check A1c - appreciate diabetic coordinator assistance    Elevated troponin,, CAD (coronary artery disease) - troponin 0.45 on the admission; likely demand ischemia due to CKD - troponin trended down to 0.34 --> 0/36 - appreciate cardiology consult and recommendations - will follow up on 2 D ECHO on this admission   Chronic systolic and diastolic heart failure - 2 D ECHO in 02/2013 showed EF 50%; follow up 2 D ECHO on this admission - BNP ~ 3,000 on this admission - pt received lasix on admission and is down 10 lbs since yesterday so per cardio lasix stopped - needs 2 D ECHO and if EF less than 35% pt may require ICD - continue coreg 3.125 mg BID   CKD (chronic kidney disease) stage 3 - creatinine 2.78 on this admission; in 04/2013 creatinine was 1.7 - lasix stopped - follow up renal function in am   Hypothyroidism - continue levothyroxine   Hypokalemia - secondary to lasix - repleted - follow up BMP in am   Dyslipidemia - continue statin therapy   Anemia of chronic disease - secondary  to CKD - hemoglobin 9.2  - no indications for transfusion   Code Status: full code Family Communication: no family at the bedside  Disposition Plan: home when stable  Manson PasseyEVINE, Calandria Mullings, MD  [facility] Pager [phone]  If 7PM-7AM, please contact night-coverage [email or web address] Password [email or web address] 07/12/2013, 7:40 AM   LOS: 1 day   Consultants:  Cardiology   Procedures:  None   Antibiotics:  None   HPI/Subjective: Says he feels unsteady and week on his feet.   Objective: Filed Vitals:   07/11/13 2224 07/12/13 0621 07/12/13 0624 07/12/13 0626  BP: 109/60 101/59 91/52 91/50   Pulse:      Temp:  98.1 F (36.7 C)    TempSrc:  Oral    Resp:  16    Height:      Weight:    79 kg (174 lb 2.6 oz)  SpO2:  100%      Intake/Output Summary (Last 24 hours) at 07/12/13 0740 Last data filed at 07/11/13 2100  Gross per 24 hour  Intake    240 ml  Output    300 ml  Net    -60 ml    Exam:   General:  Pt is alert, follows commands appropriately, not in acute distress  Cardiovascular: Regular rate and rhythm, S1/S2 appreciated   Respiratory: Clear to auscultation bilaterally, no wheezing, no crackles, no rhonchi  Abdomen: Soft, non tender, non distended, bowel sounds present, no guarding  Extremities: +1 LE pitting  edema, pulses DP and PT palpable bilaterally  Neuro: Grossly nonfocal  Data Reviewed: Basic Metabolic Panel:  Recent Labs Lab 07/11/13 1445  NA 137  K 3.6*  CL 88*  CO2 33*  GLUCOSE 170*  BUN 70*  CREATININE 2.78*  CALCIUM 9.4   Liver Function Tests: No results found for this basename: AST, ALT, ALKPHOS, BILITOT, PROT, ALBUMIN,  in the last 168 hours No results found for this basename: LIPASE, AMYLASE,  in the last 168 hours No results found for this basename: AMMONIA,  in the last 168 hours CBC:  Recent Labs Lab 07/11/13 1445  WBC 5.2  NEUTROABS 3.5  HGB 9.2*  HCT 29.3*  MCV 86.7  PLT 300   Cardiac Enzymes:  Recent Labs Lab  07/11/13 1445 07/11/13 2055 07/12/13 0520  TROPONINI 0.45* 0.34* 0.36*   BNP: No components found with this basename: POCBNP,  CBG:  Recent Labs Lab 07/11/13 2038 07/11/13 2234  GLUCAP 126* 243*    No results found for this or any previous visit (from the past 240 hour(s)).   Studies: No results found.  Scheduled Meds: . aspirin EC  81 mg Oral QODAY  . carvedilol  3.125 mg Oral QPM  . furosemide  40 mg Intravenous Q12H  . heparin  5,000 Units Subcutaneous 3 times per day  . insulin aspart  0-5 Units Subcutaneous QHS  . insulin aspart  0-9 Units Subcutaneous TID WC  . insulin detemir  5 Units Subcutaneous QHS  . levothyroxine  100 mcg Oral QAC breakfast  . midodrine  10 mg Oral TID WC  . pantoprazole  40 mg Oral BID AC  . potassium chloride  10 mEq Oral BID  . simvastatin  10 mg Oral QHS

## 2013-07-12 NOTE — Progress Notes (Signed)
Hypoglycemic Event  CBG: 52  Treatment: 15 GM carbohydrate snack  Symptoms: None  Follow-up CBG: Time:2210 CBG Result:62  Possible Reasons for Event: Inadequate meal intake  Comments/MD notified:[place]    Joseph Burgess, Joseph Burgess  Remember to initiate Hypoglycemia Order Set & complete

## 2013-07-12 NOTE — Progress Notes (Signed)
Patient Name: Bertrum SolBilly Woodbury Date of Encounter: 07/12/2013     Principal Problem:   Syncope Active Problems:   A-fib   Diabetes type 1, controlled   Orthostatic hypotension   CAD (coronary artery disease)   Ischemic cardiomyopathy   Acute renal failure   CKD (chronic kidney disease) stage 3, GFR 30-59 ml/min   Chronic systolic heart failure   Hypothyroidism   Insulin dependent diabetes mellitus with renal manifestation   Diabetic nephropathy   Diabetic retinopathy   Elevated troponin   Hypokalemia   Anemia    SUBJECTIVE  Patient was admitted with recurrent episodes of near syncope.  These have not improved with cutting back on his diuretic over the last several days.  The patient has known ischemic heart disease with left ventricular dysfunction.  A repeat echocardiogram is pending.  CURRENT MEDS . [START ON 07/13/2013] aspirin EC  81 mg Oral QODAY  . carvedilol  3.125 mg Oral QPM  . furosemide  40 mg Intravenous Q12H  . heparin  5,000 Units Subcutaneous 3 times per day  . insulin aspart  0-5 Units Subcutaneous QHS  . insulin aspart  0-9 Units Subcutaneous TID WC  . insulin detemir  5 Units Subcutaneous QHS  . levothyroxine  100 mcg Oral QAC breakfast  . midodrine  10 mg Oral TID WC  . pantoprazole  40 mg Oral BID AC  . potassium chloride  10 mEq Oral BID  . simvastatin  10 mg Oral QHS  . sodium chloride  3 mL Intravenous Q12H    OBJECTIVE  Filed Vitals:   07/11/13 2224 07/12/13 0621 07/12/13 0624 07/12/13 0626  BP: 109/60 101/59 91/52 91/50   Pulse:      Temp:  98.1 F (36.7 C)    TempSrc:  Oral    Resp:  16    Height:      Weight:    174 lb 2.6 oz (79 kg)  SpO2:  100%      Intake/Output Summary (Last 24 hours) at 07/12/13 0829 Last data filed at 07/11/13 2100  Gross per 24 hour  Intake    240 ml  Output    300 ml  Net    -60 ml   Filed Weights   07/11/13 1427 07/11/13 2014 07/12/13 0626  Weight: 184 lb (83.462 kg) 175 lb 8 oz (79.606 kg) 174 lb 2.6  oz (79 kg)    PHYSICAL EXAM  General: Pleasant, NAD. Neuro: Alert and oriented X 3. Moves all extremities spontaneously. Psych: Normal affect. HEENT:  Normal  Neck: Supple without bruits .  Jugular venous pressure is elevated Lungs:  Resp regular and unlabored, CTA. Heart: RRR no s3, s4, .  There is a soft systolic murmur at the base. Abdomen: Soft, non-tender, non-distended, BS + x 4.  Extremities: There is bilateral pretibial edema and ankle edema, worse on the right.  Accessory Clinical Findings  CBC  Recent Labs  07/11/13 1445  WBC 5.2  NEUTROABS 3.5  HGB 9.2*  HCT 29.3*  MCV 86.7  PLT 300   Basic Metabolic Panel  Recent Labs  07/11/13 1445  NA 137  K 3.6*  CL 88*  CO2 33*  GLUCOSE 170*  BUN 70*  CREATININE 2.78*  CALCIUM 9.4   Liver Function Tests No results found for this basename: AST, ALT, ALKPHOS, BILITOT, PROT, ALBUMIN,  in the last 72 hours No results found for this basename: LIPASE, AMYLASE,  in the last 72 hours Cardiac Enzymes  Recent  Labs  07/11/13 1445 07/11/13 2055 07/12/13 0520  TROPONINI 0.45* 0.34* 0.36*   BNP No components found with this basename: POCBNP,  D-Dimer No results found for this basename: DDIMER,  in the last 72 hours Hemoglobin A1C No results found for this basename: HGBA1C,  in the last 72 hours Fasting Lipid Panel No results found for this basename: CHOL, HDL, LDLCALC, TRIG, CHOLHDL, LDLDIRECT,  in the last 72 hours Thyroid Function Tests  Recent Labs  07/11/13 2058  TSH 2.227    TELE  Telemetry shows normal sinus rhythm  ECG  Sinus rhythm Inferior infarct, old Anterior infarct, old Non-specific ST-t changes  Radiology/Studies  No results found.  ASSESSMENT AND PLAN  1.  Dizziness most likely secondary to low blood pressure and orthostatic hypotension.  Blood pressure this morning is low and drops lower with standing.  Weight is down 10 pounds since yesterday.  Will stop diuretic at this point.   He would benefit from compression hose on his legs. 2. chronic renal insufficiency stage IV 3. ischemic cardiomyopathy, repeat echo pending.  Consider ICD placement if EF less than 35%.  Plan: Chest x-ray PA and lateral today.  He does not appear to be volume overloaded anymore although peripheral edema persists and may be secondary to venous insufficiency.  Low blood pressure we'll hold off on further Lasix at this point.  Follow renal function closely. Signed, Cassell Clement

## 2013-07-12 NOTE — Progress Notes (Signed)
UR completed 

## 2013-07-13 ENCOUNTER — Inpatient Hospital Stay (HOSPITAL_COMMUNITY): Payer: Medicare Other

## 2013-07-13 DIAGNOSIS — I059 Rheumatic mitral valve disease, unspecified: Secondary | ICD-10-CM

## 2013-07-13 DIAGNOSIS — I2589 Other forms of chronic ischemic heart disease: Secondary | ICD-10-CM

## 2013-07-13 DIAGNOSIS — E875 Hyperkalemia: Secondary | ICD-10-CM

## 2013-07-13 DIAGNOSIS — D649 Anemia, unspecified: Secondary | ICD-10-CM | POA: Diagnosis present

## 2013-07-13 DIAGNOSIS — N189 Chronic kidney disease, unspecified: Secondary | ICD-10-CM

## 2013-07-13 DIAGNOSIS — N179 Acute kidney failure, unspecified: Secondary | ICD-10-CM | POA: Diagnosis not present

## 2013-07-13 DIAGNOSIS — I5023 Acute on chronic systolic (congestive) heart failure: Secondary | ICD-10-CM | POA: Diagnosis present

## 2013-07-13 LAB — GLUCOSE, CAPILLARY
GLUCOSE-CAPILLARY: 96 mg/dL (ref 70–99)
GLUCOSE-CAPILLARY: 208 mg/dL — AB (ref 70–99)
GLUCOSE-CAPILLARY: 151 mg/dL — AB (ref 70–99)
GLUCOSE-CAPILLARY: 35 mg/dL — AB (ref 70–99)
GLUCOSE-CAPILLARY: 80 mg/dL (ref 70–99)
GLUCOSE-CAPILLARY: 169 mg/dL — AB (ref 70–99)
Glucose-Capillary: 297 mg/dL — ABNORMAL HIGH (ref 70–99)

## 2013-07-13 LAB — CBC
HEMATOCRIT: 26.7 % — AB (ref 39.0–52.0)
Hemoglobin: 8.2 g/dL — ABNORMAL LOW (ref 13.0–17.0)
MCH: 26.2 pg (ref 26.0–34.0)
MCHC: 30.7 g/dL (ref 30.0–36.0)
MCV: 85.3 fL (ref 78.0–100.0)
Platelets: 252 10*3/uL (ref 150–400)
RBC: 3.13 MIL/uL — ABNORMAL LOW (ref 4.22–5.81)
RDW: 14.8 % (ref 11.5–15.5)
WBC: 6 10*3/uL (ref 4.0–10.5)

## 2013-07-13 LAB — BASIC METABOLIC PANEL
BUN: 80 mg/dL — ABNORMAL HIGH (ref 6–23)
BUN: 78 mg/dL — ABNORMAL HIGH (ref 6–23)
CHLORIDE: 89 meq/L — AB (ref 96–112)
CO2: 34 meq/L — AB (ref 19–32)
CO2: 30 mEq/L (ref 19–32)
Calcium: 9.4 mg/dL (ref 8.4–10.5)
Calcium: 9.6 mg/dL (ref 8.4–10.5)
Chloride: 89 mEq/L — ABNORMAL LOW (ref 96–112)
Creatinine, Ser: 3.27 mg/dL — ABNORMAL HIGH (ref 0.50–1.35)
Creatinine, Ser: 3.13 mg/dL — ABNORMAL HIGH (ref 0.50–1.35)
GFR calc Af Amer: 19 mL/min — ABNORMAL LOW (ref >90)
GFR calc non Af Amer: 16 mL/min — ABNORMAL LOW (ref >90)
GFR calc non Af Amer: 17 mL/min — ABNORMAL LOW (ref >90)
GFR, EST AFRICAN AMERICAN: 20 mL/min — AB (ref >90)
Glucose, Bld: 283 mg/dL — ABNORMAL HIGH (ref 70–99)
Glucose, Bld: 256 mg/dL — ABNORMAL HIGH (ref 70–99)
POTASSIUM: 5.3 meq/L (ref 3.7–5.3)
Potassium: 6.1 mEq/L — ABNORMAL HIGH (ref 3.7–5.3)
Sodium: 137 mEq/L (ref 137–147)
Sodium: 137 mEq/L (ref 137–147)

## 2013-07-13 LAB — HEMOGLOBIN A1C
HEMOGLOBIN A1C: 7.4 % — AB (ref <5.7)
Mean Plasma Glucose: 166 mg/dL — ABNORMAL HIGH (ref <117)

## 2013-07-13 MED ORDER — SODIUM CHLORIDE 0.9 % IV SOLN
25.0000 mg | Freq: Once | INTRAVENOUS | Status: AC
  Administered 2013-07-13: 25 mg via INTRAVENOUS
  Filled 2013-07-13: qty 0.5

## 2013-07-13 MED ORDER — SODIUM CHLORIDE 0.9 % IV SOLN: INTRAVENOUS | Status: AC

## 2013-07-13 MED ORDER — SODIUM CHLORIDE 0.9 % IV SOLN
500.0000 mg | Freq: Every day | INTRAVENOUS | Status: AC
  Administered 2013-07-13 – 2013-07-15 (×3): 500 mg via INTRAVENOUS
  Filled 2013-07-13 (×6): qty 10

## 2013-07-13 NOTE — Progress Notes (Signed)
Echocardiogram 2D Echocardiogram has been performed.  Joseph Burgess 07/13/2013, 12:01 PM

## 2013-07-13 NOTE — Progress Notes (Addendum)
I have seen and evaluated the patient this PM along with Boyce Medici, PA. I agree with her findings, examination as well as impression recommendations.  80 y/o with what appears to be chronic ICM (unknown anatomy) EF 25-35% in 03/2013, PAD.  P/w SSx of dizziness & near syncope.  Troponin on initial eval was mildly elevated.  Noted 4 lb wgt gain. Prior to admission. After initial diuresis, remains orthostatic. --> diuretic stopped.  O midodrine.  Recommend compression stockings to help with edema redistribution. Troponin elevation likely not related to Ischemic presentation.  Echo pending -- agree that if EF remains < 30-35%, would consider EP consult for ICD (defer to EP re possible benefit from BiVICD).  Agree that with Hgb of 8.2 - would at least initiate iron supplementation,but if < 8, would transfuse to at least 9 for CM benefit. --> @ this level, would recommend IV Iron Rx & recheck Hgb in AM. (has h/o GI bleed after initiation of Eliquis in Dec).   Marykay Lex, M.D., M.S. Interventional Cardiologist  [facility] Pager # [phone] 07/13/2013

## 2013-07-13 NOTE — Progress Notes (Addendum)
[facility] PROGRESS NOTE  Joseph Burgess [name]:[id number] DOB: 1930/04/01 DOA: 07/11/2013 PCP: Lauro RegulusANDERSON,MARSHALL W., MD  Brief narrative: 80 y.o. male with past medical history of chronic systolic CHF, chronic ICM, EF 95-62%25-35%, CKD stage IV, long standing DM 1 on insulin pump who [facility] with near syncope. He reported he has frequent near syncopal events and very often feels very unsteady on his feet. Pt was found to have troponin of 0.45, BNP of ~3,000, hemoglobin of 9.2, potassium of 3.6 and creatinine of 2.78. The 12 lead EKG showed no acute ischemic events. Additionally, his weight was up 8 pounds in 1 week period.  Assessment/Plan:  Principal Problem:   Weakness, near syncope - perhaps secondary to orthostatic hypotension, BP's soft but not orthostatic on 3/16 - continue midodrine 10 mg PO TID - will need PT eval prior to discharge  - Diuretics stopped due to orthostatic hypotension/hypotension - Syncope (likely)/seizure (less likely) episode overnight- BP 104/52 and CBG was 209. Will check EEG in any event and if has recurrent episodes may need Neurology consultation. No arrythmia's on monitor  Active Problems:   Diabetes type 1 with renal manifestations & hypoglycemia - will restart pt home insulin pump - check A1c - appreciate diabetic coordinator assistance- requested assistance again on 3/16 due to hypoglycemic episodes     Elevated troponin, CAD (coronary artery disease) - troponin 0.45 on the admission; likely demand ischemia due to CKD - troponin trended down to 0.34 --> 0.33 - appreciate cardiology consult and recommendations- EP consult for ICD if EF remains < 30-35%, IV iron and if Hb < 8 transfuse to atleast 9. - will follow up on 2 D ECHO on this admission    Acute on Chronic systolic heart failure - 2 D ECHO in 2014 showed EF 25-35% - BNP ~ 3,000 on this admission - pt received lasix on admission and is down 10 lbs & diuretic DC'ed due to low  BP's. - 2 D Echo 07/13/2013: mild LVH, LVEF; 10-15% with Akinesis/hypokinesis of several areas- EP consult per cardiology - continue coreg 3.125 mg BID   Acute on CKD (chronic kidney disease) stage 3 - creatinine 2.78 on this admission; in 04/2013 creatinine was 1.7 - lasix stopped - Hold Potassium supplements. Diuretics DC'ed. Not on ACEI/ARB due to renal failure. Likely due to diuretics/hemodyanamics/cardiorenal syndrome. - Follow BMP in am. - brief IVF    Hypothyroidism - continue levothyroxine     Hypokalemia/Hyperkalemia - secondary to lasix - overcorrected. DC K    Dyslipidemia - continue statin therapy    Anemia of chronic disease/CKD - secondary to CKD - hemoglobin 8.2  - no indications for transfusion currently. IV Iron   Code Status: Full code Family Communication: no family at the bedside  Disposition Plan: home when stable  Cibola General HospitalNGALGI,Joseph Puett, MD  [facility] Pager [phone]  If 7PM-7AM, please contact night-coverage [email or web address] Password [email or web address] 07/13/2013, 2:28 PM   LOS: 2 days   Consultants:  Cardiology   Procedures:  None   Antibiotics:  None   HPI/Subjective: Patient had an episode of unresponsiveness on 3/16 early a.m., with associated teeth grinding and lower extremity mild jerky movements. Patient states that he never loses complete consciousness and is aware of although it's happening around. Currently asymptomatic and anxious to go home.  Objective: Filed Vitals:   07/13/13 0859 07/13/13 0903 07/13/13 0922 07/13/13 1200  BP: 110/55 116/57  120/62  Pulse: 81 83 87 81  Temp:    97.7 F (  36.5 C)  TempSrc:    Oral  Resp:    20  Height:      Weight:      SpO2:    97%    Intake/Output Summary (Last 24 hours) at 07/13/13 1428 Last data filed at 07/13/13 1200  Gross per 24 hour  Intake    843 ml  Output    225 ml  Net    618 ml    Exam:   General:  Pt is alert, follows commands appropriately, not in acute  distress  Cardiovascular: Regular rate and rhythm, S1/S2 appreciated. Telemetry: Sinus rhythm  Respiratory: Clear to auscultation bilaterally, no wheezing, no crackles, no rhonchi  Abdomen: Soft, non tender, non distended, bowel sounds present, no guarding  Extremities: +1 LE pitting  edema, pulses DP and PT palpable bilaterally  Neuro: Grossly nonfocal. Alert and oriented x3.  Data Reviewed: Basic Metabolic Panel:  Recent Labs Lab 07/11/13 1445 07/12/13 1019 07/13/13 0512 07/13/13 0930  NA 137 135* 137 137  K 3.6* 3.8 6.1* 5.3  CL 88* 88* 89* 89*  CO2 33* 32 34* 30  GLUCOSE 170* 255* 283* 256*  BUN 70* 75* 80* 78*  CREATININE 2.78* 2.75* 3.27* 3.13*  CALCIUM 9.4 9.0 9.4 9.6   Liver Function Tests: No results found for this basename: AST, ALT, ALKPHOS, BILITOT, PROT, ALBUMIN,  in the last 168 hours No results found for this basename: LIPASE, AMYLASE,  in the last 168 hours No results found for this basename: AMMONIA,  in the last 168 hours CBC:  Recent Labs Lab 07/11/13 1445 07/13/13 0512  WBC 5.2 6.0  NEUTROABS 3.5  --   HGB 9.2* 8.2*  HCT 29.3* 26.7*  MCV 86.7 85.3  PLT 300 252   Cardiac Enzymes:  Recent Labs Lab 07/11/13 1445 07/11/13 2055 07/12/13 0520 07/12/13 1019  TROPONINI 0.45* 0.34* 0.36* 0.33*   BNP: No components found with this basename: POCBNP,  CBG:  Recent Labs Lab 07/12/13 2210 07/12/13 2321 07/13/13 0154 07/13/13 0720 07/13/13 1120  GLUCAP 62* 96 208* 297* 151*    No results found for this or any previous visit (from the past 240 hour(s)).   Studies: Dg Chest 2 View  07/12/2013   CLINICAL DATA:  Chest pain and shortness of breath  EXAM: CHEST  2 VIEW  COMPARISON:  06/16/2012  FINDINGS: Cardiac shadow is stable but mildly enlarged. Patchy infiltrative changes are noted in the mid lungs bilaterally. Portion of this may be chronic in nature as similar infiltrates are noted on the previous exam. No sizable effusion is seen. The  bony structures are within normal limits.  IMPRESSION: Bilateral patchy infiltrates. A portion of this may be chronic in nature. Followup films are recommended.   Electronically Signed   By: Alcide Clever M.D.   On: 07/12/2013 09:41    Scheduled Meds: . aspirin EC  81 mg Oral QODAY  . carvedilol  3.125 mg Oral QPM  . furosemide  40 mg Intravenous Q12H  . heparin  5,000 Units Subcutaneous 3 times per day  . insulin aspart  0-5 Units Subcutaneous QHS  . insulin aspart  0-9 Units Subcutaneous TID WC  . insulin detemir  5 Units Subcutaneous QHS  . levothyroxine  100 mcg Oral QAC breakfast  . midodrine  10 mg Oral TID WC  . pantoprazole  40 mg Oral BID AC  . potassium chloride  10 mEq Oral BID  . simvastatin  10 mg Oral QHS

## 2013-07-13 NOTE — Progress Notes (Signed)
0150, pt bed alarm sounded.  Upon entering, pt alert and oriented and attempting to go to bathroom to have a bowel movement.  On assisting pt to stand up, pt sat back on bed and said he was sitting down.  Pt then lied back on bed with eyes open, unable to respond to verbal stimuli, pt grinding teeth and lower extremities making mild jerking movements.  Episode lasting approx 45 seconds, pt alert and oriented afterwards, no incontinence during.  Pt stated he could hear staff speaking to him.  BP 104/52, HR 79, 97% RA, CBG 209.  [facility] PA notified via text.  Will continue to monitor.

## 2013-07-13 NOTE — Progress Notes (Signed)
MEDICATION RELATED CONSULT NOTE - INITIAL   Pharmacy Consult for Iron Dextran Indication: Fe Deficiency Anemia  No Known Allergies  Patient Measurements: Height: 5\' 8"  (172.7 cm) Weight: 174 lb 2.6 oz (79 kg) IBW/kg (Calculated) : 68.4  Vital Signs: Temp: 97.7 F (36.5 C) (03/16 1200) Temp src: Oral (03/16 1200) BP: 120/62 mmHg (03/16 1200) Pulse Rate: 81 (03/16 1200) Intake/Output from previous day: 03/15 0701 - 03/16 0700 In: 903 [P.O.:900; I.V.:3] Out: 225 [Urine:225] Intake/Output from this shift: Total I/O In: 600 [P.O.:600] Out: -   Labs:  Recent Labs  07/11/13 1445 07/12/13 1019 07/13/13 0512 07/13/13 0930  WBC 5.2  --  6.0  --   HGB 9.2*  --  8.2*  --   HCT 29.3*  --  26.7*  --   PLT 300  --  252  --   CREATININE 2.78* 2.75* 3.27* 3.13*   Estimated Creatinine Clearance: 17.9 ml/min (by C-G formula based on Cr of 3.13).  Iron/TIBC/Ferritin    Component Value Date/Time   IRON 16* 07/12/2013 0520   TIBC 371 07/12/2013 0520   FERRITIN 18* 07/12/2013 0520   Microbiology: No results found for this or any previous visit (from the past 720 hour(s)).  Medical History: Past Medical History  Diagnosis Date  . Hypertension   . Diabetes mellitus without complication   . Thyroid disease   . History of GI bleed   . Diabetic retinopathy   . Diabetic peripheral neuropathy   . A-fib 2014  . Anemia   . Iron deficiency   . Pneumonia and influenza 2011  . Tetanus   . MI (myocardial infarction) 2014    Medications:  Scheduled:  . aspirin EC  81 mg Oral QODAY  . carvedilol  3.125 mg Oral QPM  . heparin  5,000 Units Subcutaneous 3 times per day  . insulin pump   Subcutaneous TID AC, HS, 0200  . levothyroxine  100 mcg Oral QAC breakfast  . midodrine  10 mg Oral TID WC  . pantoprazole  40 mg Oral BID AC  . simvastatin  10 mg Oral QHS  . sodium chloride  3 mL Intravenous Q12H    Assessment: 80 yo M presented to ED 3/14 after multiple syncopal episodes  over the past few days.  Pt has hx of CKD (SCr ~ 3) and orthostatic hypotension (on midodrine).  Anemia panel shows low Fe stores.  Hgb has trended down 9.2 > 8.2, no bleeding noted.  Goal of Therapy:  Goal Iron 42-135 mcg/ml Hgb 12-17  Plan:  Patient needs a total of 1500 mg IV Fe to replete stores. Would recommend Fe Dextran 25 mg test dose followed by Fe Dextran 500 mg IV daily x 3 days. Would also recommend oral Fe replacement upon discharge.  [employer], Pharm.D., BCPS Clinical Pharmacist Pager [phone] 07/13/2013 3:06 PM

## 2013-07-13 NOTE — Progress Notes (Signed)
Inpatient Diabetes Program Recommendations  AACE/ADA: New Consensus Statement on Inpatient Glycemic Control (2013)  Target Ranges:  Prepandial:   less than 140 mg/dL      Peak postprandial:   less than 180 mg/dL (1-2 hours)      Critically ill patients:  140 - 180 mg/dL   Reason for Visit: Patient has Omnipod insulin pump.  He has had Type 1 diabetes for greater than 60 years. He put insulin pump back on yesterday morning.   His settings are: Basal: 0.5 units/hour (12 units /24 hours) He covers an average of 4 units with meals (1 unit/15 grams of CHO) but admits that he has had diabetes so long he sometimes gives more or less based on his CBG.  He states that he checks 8-12 times a day.  His last A1C was 6.7% and he see's Dr. Renae Fickle at [facility] (from [facility]).  He states that he may have given too much insulin yesterday.  Also he still had some basal insulin on board.  Will follow.  He is very knowledgeable about his diet and insulin requirements.   Beryl Meager, RN, BC-ADM Inpatient Diabetes Coordinator Pager [phone]

## 2013-07-13 NOTE — Progress Notes (Signed)
Pt complaining of nausea that is unrelieved with zofran. Pt lying in bed. VSS.  02 sats 96% on room air. Pt placed on 2L o2 for comfort. Pt states that "oxygen makes my nausea go completely away." Will notify Dr Waymon Amato. Levonne Spiller, RN

## 2013-07-13 NOTE — Progress Notes (Signed)
Physical Therapy Treatment Patient Details Name: Joseph Burgess MRN: [id number] DOB: July 12, 1929 Today's Date: 07/13/2013 Time: 7989-2119 PT Time Calculation (min): 19 min  PT Assessment / Plan / Recommendation  History of Present Illness Patient is an 80 yo male who came to ED with multiple syncopal/near syncopal spells over the past few days.  Has h/o orthostatic hypotension on midodrine, systolic CHF, CAD, CKD, insulin dependent DM on insulin pod, which is a tubeless pump.  Spells occur when standing. Last a few seconds. Patient never loses consiousness, no postictal period.   PT Comments   Patient much more steady with use of RW.  Encouraged patient to use RW at all times when up.  Spoke with patient and wife.  They agree to HHPT for f/u therapy at discharge.   Follow Up Recommendations  Home health PT;Supervision/Assistance - 24 hour (recommend HH Aide for ADL's)     Does the patient have the potential to tolerate intense rehabilitation     Barriers to Discharge        Equipment Recommendations  None recommended by PT    Recommendations for Other Services    Frequency Min 3X/week   Progress towards PT Goals Progress towards PT goals: Progressing toward goals  Plan Current plan remains appropriate    Precautions / Restrictions Precautions Precautions: Fall Precaution Comments: Has had a fall during episode of weakness at home. Restrictions Weight Bearing Restrictions: No Other Position/Activity Restrictions: does better with shoes for gait   Pertinent Vitals/Pain     Mobility  Transfers Overall transfer level: Needs assistance Equipment used: Rolling walker (2 wheeled) Transfers: Sit to/from Stand Sit to Stand: Min guard General transfer comment: Patient uses safe hand placement.  Patient with decreased balance in standing, with wide BOS. Ambulation/Gait Ambulation/Gait assistance: Min guard Ambulation Distance (Feet): 220 Feet Assistive device: Rolling walker (2  wheeled) Gait Pattern/deviations: Step-through pattern;Decreased step length - right;Decreased step length - left;Steppage General Gait Details: Verbal cues for safe use of RW.  Balance improved with RW.      Exercises     PT Diagnosis:    PT Problem List:   PT Treatment Interventions:     PT Goals (current goals can now be found in the care plan section)    Visit Information  Last PT Received On: 07/13/13 Assistance Needed: +1 History of Present Illness: Patient is an 80 yo male who came to ED with multiple syncopal/near syncopal spells over the past few days.  Has h/o orthostatic hypotension on midodrine, systolic CHF, CAD, CKD, insulin dependent DM on insulin pod, which is a tubeless pump.  Spells occur when standing. Last a few seconds. Patient never loses consiousness, no postictal period.    Subjective Data  Subjective: "I don't remember passing out last night.  They make too much of things"     Cognition  Cognition Arousal/Alertness: Awake/alert Behavior During Therapy: WFL for tasks assessed/performed Overall Cognitive Status: Within Functional Limits for tasks assessed    Balance     End of Session PT - End of Session Equipment Utilized During Treatment: Gait belt Activity Tolerance: Patient tolerated treatment well;Patient limited by fatigue Patient left: in chair;with call bell/phone within reach;with family/visitor present Nurse Communication: Mobility status   GP     [name] 07/13/2013, 1:04 PM Durenda Hurt. Renaldo Fiddler, [facility] Acute Rehab Services Pager [phone]

## 2013-07-13 NOTE — Progress Notes (Signed)
Patient Profile: Mr. Cammisa is an 80-y/o M with history of atrial fibrillation, hypertension, type 1 DM, and recurrent syncope/near syncope, whom we have been asked to see by the hospitalist service to assist with further evaluation of mild troponin elevation in the setting of recurrent syncope. The patient reports frequent near-syncopal episodes that began after he was hospitalized with a GI bleed complicated by NSTEMI and decompensated HF with LVEF 25-35% in 03/2013. He underwent myocardial perfusion stress test that revealed anterior and inferior fixed defects without ischemia. Due to renal insufficiency, coronary angiography was not pursued. The patient is no longer on Eliquis due to h/o GI bleeding. He was recently started on a low-dose aspirin every other day without obvious bleeding.   Since then, the patient reports feeling lightheaded at least 2-3 times per day. This typically occurs shortly after standing and lasts anywhere from 30 seconds to 2-3 minutes.  The patient has been diagnosed with orthostatic hypotension, for which he has been taking midodrine.    Subjective: He denies CP. He continues to have occasional presyncopal like episodes when transitioning from sitting to standing.   Objective: Vital signs in last 24 hours: Temp:  [97.8 F (36.6 C)-98.2 F (36.8 C)] 98.2 F (36.8 C) (03/16 0400) Pulse Rate:  [68-90] 69 (03/16 0400) Resp:  [18-20] 18 (03/16 0400) BP: (92-116)/(52-68) 116/68 mmHg (03/16 0400) SpO2:  [97 %-100 %] 100 % (03/16 0400) Last BM Date: 07/11/13  Intake/Output from previous day: 03/15 0701 - 03/16 0700 In: 903 [P.O.:900; I.V.:3] Out: 225 [Urine:225] Intake/Output this shift:    Medications Current Facility-Administered Medications  Medication Dose Route Frequency Provider Last Rate Last Dose  . acetaminophen (TYLENOL) tablet 650 mg  650 mg Oral Q6H PRN Christiane Ha, MD   650 mg at 07/11/13 2249   Or  . acetaminophen (TYLENOL) suppository  650 mg  650 mg Rectal Q6H PRN Christiane Ha, MD      . aspirin EC tablet 81 mg  81 mg Oral QODAY Christopher A. End, MD      . carvedilol (COREG) tablet 3.125 mg  3.125 mg Oral QPM Christiane Ha, MD   3.125 mg at 07/12/13 1644  . heparin injection 5,000 Units  5,000 Units Subcutaneous 3 times per day Christiane Ha, MD   5,000 Units at 07/12/13 2154  . insulin pump   Subcutaneous TID AC, HS, 0200 Alison Murray, MD      . levothyroxine (SYNTHROID, LEVOTHROID) tablet 100 mcg  100 mcg Oral QAC breakfast Christiane Ha, MD   100 mcg at 07/13/13 5329  . midodrine (PROAMATINE) tablet 10 mg  10 mg Oral TID WC Christiane Ha, MD   10 mg at 07/13/13 9242  . ondansetron (ZOFRAN) tablet 4 mg  4 mg Oral Q6H PRN Christiane Ha, MD       Or  . ondansetron [facility]) injection 4 mg  4 mg Intravenous Q6H PRN Christiane Ha, MD   4 mg at 07/12/13 2030  . pantoprazole (PROTONIX) EC tablet 40 mg  40 mg Oral BID AC Christiane Ha, MD   40 mg at 07/13/13 6834  . potassium chloride (K-DUR,KLOR-CON) CR tablet 10 mEq  10 mEq Oral BID Christiane Ha, MD   10 mEq at 07/12/13 2154  . senna-docusate (Senokot-S) tablet 1 tablet  1 tablet Oral QHS PRN Christiane Ha, MD      . simvastatin (ZOCOR) tablet 10 mg  10 mg Oral  QHS Christiane Haorinna L Sullivan, MD   10 mg at 07/12/13 2154  . sodium chloride 0.9 % injection 3 mL  3 mL Intravenous Q12H Christiane Haorinna L Sullivan, MD   3 mL at 07/12/13 2031    PE: General appearance: alert, cooperative and no distress Neck: no carotid bruit Lungs: clear to auscultation bilaterally Heart: regular rate and rhythm Extremities: 2+ pitting bilateral LEE Pulses: 2+ and symmetric Skin: warm and dry Neurologic: Grossly normal  Lab Results:   Recent Labs  07/11/13 1445 07/13/13 0512  WBC 5.2 6.0  HGB 9.2* 8.2*  HCT 29.3* 26.7*  PLT 300 252   BMET  Recent Labs  07/11/13 1445 07/12/13 1019 07/13/13 0512  NA 137 135* 137  K 3.6* 3.8 6.1*  CL 88* 88*  89*  CO2 33* 32 34*  GLUCOSE 170* 255* 283*  BUN 70* 75* 80*  CREATININE 2.78* 2.75* 3.27*  CALCIUM 9.4 9.0 9.4   Cardiac Panel (last 3 results)  Recent Labs  07/11/13 2055 07/12/13 0520 07/12/13 1019  TROPONINI 0.34* 0.36* 0.33*     Assessment/Plan  Principal Problem:   Near syncope Active Problems:   Diabetes type 1, controlled   Ischemic cardiomyopathy   Chronic systolic heart failure   Hypothyroidism   Insulin dependent diabetes mellitus with renal manifestation   Diabetic retinopathy   Elevated troponin   Hypokalemia   Unspecified hypothyroidism   Dyslipidemia  Plan: 80 y/o WM with h/o paroxysmal atrial fibrillation, cardiomyopathy (likely ischemic) and recent NSTEMI/decompensated HF in the setting of GI bleed, who presents for further evaluation of recurrent near-syncope.  1. Near-syncope: Physical exam negative for carotid bruits. Telemetry shows no arrhthymias. Symptoms appear to be consistent with orthostatic hypotension.  Continue midodrine.   2. Elevated troponin:  ? demand ischemia. This is in the setting of chronic anemia and CKD. His EKG on this admission demonstrates non specific ST abnormalities, also seen on prior EKGs. He denies CP. His Hgb has dropped from 9.2 to 8.2 in the last 2 days. Recommend following Hgb daily. Transfuse if necessary.   2. Ischemic Cardiomyopathy: 2D echo has been ordered. If EF is <35%, may consider an ICD. Continue BB. No ACE/ARB secondary to CKD. He has 2+ bilateral pitting edema. Lasix is on hold due to problems with hypotension. Continue with compression stockings.   3. Hyperkalemia: K on BMP today is 6.1. ? If this a true reading. ? Hemolysis during sample collection. May need to repeat. For now, suggest holding daily potassium supplement. No arrhthymias noted on telemetry.   MD to follow.     LOS: 2 days    Brittainy M. Sharol HarnessSimmons, PA-C 07/13/2013 8:15 AM

## 2013-07-13 NOTE — Progress Notes (Signed)
EEG Completed; Results Pending  

## 2013-07-13 NOTE — Clinical Documentation Improvement (Signed)
  Chronic CHF documented on admission. Please clarify acuity based on the clinical information below to illustrate severity of illness and risk of mortality. Thank you.  Possible Clinical Conditions? - Decompensated Systolic-Diastolic CHF - Acute on Chronic Systolic Congestive Heart Failure - Acute on Chronic Combined CHF - Other Condition (please specify)   Supporting Information: - Chronic Combined CHF - "Patient appears volume overloaded on exam today with elevated JVP and BLE edema" - 3/14 BNP 3000 - 3/14 Weight gain 4 pounds over past week, 3/16 2+ bilateral pitting edema, weight was up 8 pounds in 1 week, received Lasix on admit and is down 10 lbs since yesterday  - Acute on chronic renal insufficiency: May be due to worsening HF - Treatment: 3/14 Switch torsemide to Lasix 40 mg IV BID; titrate for net negative 1-2 L in 24 hours, 3/16 Lasix on hold due to hypotension 91/50  Thank You, Beverley Fiedler ,RN Clinical Documentation Specialist:  [phone]  [facility]- Health Information Management

## 2013-07-13 NOTE — Consult Note (Signed)
ELECTROPHYSIOLOGY CONSULT NOTE   Patient ID: Joseph SolBilly Nardone MRN: [id number], DOB/AGE: [id number]   Admit date: 07/11/2013 Date of Consult: 07/13/2013  Primary Physician: Einar CrowMarshall Anderson, MD Primary Cardiologist: Mariah MillingGollan, MD Reason for Consultation: Syncope and recommendation regarding possible device therapy / ICD  History of Present Illness Joseph Burgess is an 80 y.o. male with atrial fibrillation diagnosed Oct 2014, hypertension, type 1 DM, prior GI bleed 4 years ago due to AV malformation and recurrent near syncope. He has been followed by Dr. Mariah MillingGollan. Mr. Thayer JewHole was hospitalized with a GI bleed which complicated by NSTEMI and decompensated HF with LVEF 25-35% in Dec 2014. He underwent myocardial perfusion stress test that revealed anterior and inferior fixed defects without ischemia. Due to renal insufficiency, coronary angiography was not pursued. Due to GI bleeding, he is no longer on Eliquis. Since then, he reports feeling lightheaded with near-syncopal episodes at least 2-3 times per day. This always occurs with postural / positional changes, immediately after standing and lasts anywhere from 30 seconds to 2 minutes. He has never had frank syncope. He always has warning and tries to sit back down as quickly as possible. He has been diagnosed with orthostatic hypotension for which he has been taking midodrine. He tells me at home if his SBP falls into the 80s he feels "terrible" and lightheaded. He states when his SBP is 110-115 he feels fine. An echo was done revealing worsened LV dysfunction with an LVEF 10-15%. EP has been asked to see to for recommendations regarding further work-up for near-syncope in addition to possible CRT. He denies CP. He has chronic DOE. He denies palpitations. Of note, he has had near-syncopal episodes while here with no arrhythmias on telemetry.  Past Medical History Past Medical History  Diagnosis Date  . Hypertension   . Diabetes mellitus without complication   .  Thyroid disease   . History of GI bleed   . Diabetic retinopathy   . Diabetic peripheral neuropathy   . A-fib 2014  . Anemia   . Iron deficiency   . Pneumonia and influenza 2011  . Tetanus   . MI (myocardial infarction) 2014    Past Surgical History Past Surgical History  Procedure Laterality Date  . Back surgery    . Vitrectomy      bilateral  . Foot surgery      bilateral  . Fetal blood transfusion      GI  . Colonoscopy      Allergies/Intolerances No Known Allergies  Current Home Medications      carvedilol 3.125 MG tablet  Commonly known as:  COREG  Take 3.125 mg by mouth every evening.     levothyroxine 100 MCG tablet  Commonly known as:  SYNTHROID, LEVOTHROID  Take 100 mcg by mouth daily before breakfast.     lovastatin 20 MG tablet  Commonly known as:  MEVACOR  Take 20 mg by mouth at bedtime.     midodrine 5 MG tablet  Commonly known as:  PROAMATINE  Take 10 mg by mouth every morning.     OMNIPOD Misc  by Continuous infusion (non-IV) route as needed ("Humalog" insulin).     pantoprazole 40 MG tablet  Commonly known as:  PROTONIX  Take 40 mg by mouth 2 (two) times daily before a meal.     potassium chloride 10 MEQ tablet  Commonly known as:  K-DUR,KLOR-CON  Take 10 mEq by mouth daily at 12 noon.     torsemide 20 MG tablet  Commonly known as:  DEMADEX  Take 20 mg by mouth 2 (two) times daily.     Inpatient Medications . aspirin EC  81 mg Oral QODAY  . carvedilol  3.125 mg Oral QPM  . heparin  5,000 Units Subcutaneous 3 times per day  . insulin pump   Subcutaneous TID AC, HS, 0200  . iron dextran (INFED/DEXFERRUM) infusion  500 mg Intravenous Daily  . levothyroxine  100 mcg Oral QAC breakfast  . midodrine  10 mg Oral TID WC  . pantoprazole  40 mg Oral BID AC  . simvastatin  10 mg Oral QHS  . sodium chloride  3 mL Intravenous Q12H   . sodium chloride 50 mL/hr at 07/13/13 1531    Family History Family History  Problem Relation Age of  Onset  . Heart attack Father 36  . Heart attack Brother 54  . Heart attack Paternal Grandfather      Social History History   Social History  . Marital Status: Married    Spouse Name: N/A    Number of Children: N/A  . Years of Education: N/A   Occupational History  . Not on file.   Social History Main Topics  . Smoking status: Never Smoker   . Smokeless tobacco: Not on file  . Alcohol Use: No  . Drug Use: No  . Sexual Activity: Not on file   Other Topics Concern  . Not on file   Social History Narrative  . No narrative on file     Review of Systems General: No chills, fever, night sweats or weight changes  Cardiovascular:  No chest pain, dyspnea on exertion, edema, orthopnea, palpitations, paroxysmal nocturnal dyspnea Dermatological: No rash, lesions or masses Respiratory: No cough, dyspnea Urologic: No hematuria, dysuria Abdominal: No nausea, vomiting, diarrhea, bright red blood per rectum, melena, or hematemesis Neurologic: No visual changes, weakness, changes in mental status All other systems reviewed and are otherwise negative except as noted above.  Physical Exam Vitals: Blood pressure 120/62, pulse 81, temperature 97.7 F (36.5 C), temperature source Oral, resp. rate 20, height 5\' 8"  (1.727 m), weight 174 lb 2.6 oz (79 kg), SpO2 97.00%.  General: Well developed 80 y.o. male in no acute distress. HEENT: Normocephalic, atraumatic. EOMs intact. Sclera nonicteric. Oropharynx clear.  Neck: Supple. No JVD. Lungs: Respirations regular and unlabored. Few rales at right base otherwise CTA bilaterally. No wheezes or rhonchi. Heart: RRR. S1, S2 present. No murmurs, rub, S3 or S4. Abdomen: Soft, non-tender, non-distended. BS present x 4 quadrants. No hepatosplenomegaly.  Extremities: No clubbing or cyanosis. Trace pedal edema bilaterally. DP/PT/Radials 2+ and equal bilaterally. Psych: Normal affect. Neuro: Alert and oriented X 3. Moves all extremities  spontaneously. Musculoskeletal: No kyphosis. Skin: Intact. Warm and dry. No rashes or petechiae in exposed areas.   Labs  Recent Labs  07/11/13 1445 07/11/13 2055 07/12/13 0520 07/12/13 1019  TROPONINI 0.45* 0.34* 0.36* 0.33*   Lab Results  Component Value Date   WBC 6.0 07/13/2013   HGB 8.2* 07/13/2013   HCT 26.7* 07/13/2013   MCV 85.3 07/13/2013   PLT 252 07/13/2013    Recent Labs Lab 07/13/13 0930  NA 137  K 5.3  CL 89*  CO2 30  BUN 78*  CREATININE 3.13*  CALCIUM 9.6  GLUCOSE 256*    Recent Labs  07/11/13 2058  TSH 2.227    Recent Labs  07/12/13 0520  VITAMINB12 983*  FOLATE >20.0  FERRITIN 18*  TIBC 371  IRON 16*  Radiology/Studies Dg Chest 2 View 07/12/2013    FINDINGS: Cardiac shadow is stable but mildly enlarged. Patchy infiltrative changes are noted in the mid lungs bilaterally. Portion of this may be chronic in nature as similar infiltrates are noted on the previous exam. No sizable effusion is seen. The bony structures are within normal limits.   IMPRESSION: Bilateral patchy infiltrates. A portion of this may be chronic in nature. Followup films are recommended.    Electronically Signed   By: Alcide Clever M.D.   On: 07/12/2013 09:41   Echocardiogram  Study Conclusions - Left ventricle: The cavity size was normal. Wall thickness was increased in a pattern of mild LVH. Systolic function was severely reduced. The estimated ejection fraction was in the range of 10% to 15%. Dyskinesis of the entireanteroseptal myocardium. Akinesis. Severe hypokinesis of the lateral myocardium. - Aortic valve: Cusp separation was moderately reduced. - Mitral valve: Mild regurgitation. - Left atrium: The atrium was mildly dilated. - Right ventricle: The cavity size was mildly dilated. Systolic function was mildly reduced. - Right atrium: The atrium was mildly dilated. - Pulmonary arteries: Systolic pressure was moderately increased. PA peak pressure: 75mm Hg  (S).  Orthostatics yesterday -   Lying BP 101/59 pulse 68  Sitting BP 91/52 pulse 72  Standing BP 91/50 pulse 74 12-lead ECG on admission - sinus rhythm at 77 bpm; PR 200, QRS 94, QTc 470 Telemetry reviewed - SR; no arrhythmias  Assessment and Plan 1. Recurrent near syncope due to orthostatic hypotension - on low dose Coreg currently; would continue as BP allows - on midodrine as directed by Dr Mariah Milling 2. Ischemic CM, EF 10-15% - on Coreg; no ACEI/ARB due to renal dysfunction - meets criteria for ICD therapy as primary prevention SCD; not a candidate for CRT due to narrow QRS 3. Acute on chronic systolic heart failure - unable to take ACE inhibitor or ARB due to chronic stage 4 renal insufficiency  4. Paroxysmal atrial fibrillation - not anticoagulated due to recent GI bleed and may need GI evaluation since remains anemic (question if related to renal dysfunction or if bleeding; last reported Hgb according to Dr Windell Hummingbird office note Feb 2015 was 10.2) 5. Anemia 6. Recent GI bleed  Signed, Rick Duff, PA-C 07/13/2013, 5:08 PM  EP Attending  Patient seen and examined. Agree with above history, physical exam, assessment and plan. Note has been amended by me. A difficult set of problems. He appears to have a chance to heart failure and orthostasis. His syncopal episodes are clearly orthostasis related. He is unable to tolerate an ACE inhibitor or an ARB because of stage IV renal insufficiency. At this point, he would be a candidate for ICD implantation, which would not improve his left ventricular function as his QRS is narrow. He might be benefited by being followed with monthly fluid monitoring. Ideally we would stop his midodrine. I wonder if switching to metoprolol would be less blood pressure lowering and carvedilol. Finally, if his pressure would tolerate, a low dose of nitrates, and hydralazine, would be ideal. He is not a candidate for systemic anticoagulation with his prior GI  bleed. I wonder if amiodarone at low dose would help him maintain sinus rhythm. I would anticipate making a final decision on ICD implantation as an outpatient. His advanced age and comorbidities would make him less than ideal candidate for ICD implantation.  Lewayne Bunting, M.D.

## 2013-07-14 ENCOUNTER — Encounter (HOSPITAL_COMMUNITY)
Admission: EM | Disposition: A | Discharge status: Hospice | Payer: Self-pay | Source: Home / Self Care | Attending: Internal Medicine

## 2013-07-14 DIAGNOSIS — I5042 Chronic combined systolic (congestive) and diastolic (congestive) heart failure: Secondary | ICD-10-CM

## 2013-07-14 DIAGNOSIS — R570 Cardiogenic shock: Secondary | ICD-10-CM

## 2013-07-14 HISTORY — PX: RIGHT HEART CATHETERIZATION: SHX5447

## 2013-07-14 LAB — BASIC METABOLIC PANEL
BUN: 76 mg/dL — ABNORMAL HIGH (ref 6–23)
BUN: 69 mg/dL — ABNORMAL HIGH (ref 6–23)
CALCIUM: 9.4 mg/dL (ref 8.4–10.5)
CO2: 30 mEq/L (ref 19–32)
CO2: 31 mEq/L (ref 19–32)
CREATININE: 2.64 mg/dL — AB (ref 0.50–1.35)
Calcium: 9.4 mg/dL (ref 8.4–10.5)
Chloride: 93 mEq/L — ABNORMAL LOW (ref 96–112)
Chloride: 93 mEq/L — ABNORMAL LOW (ref 96–112)
Creatinine, Ser: 2.95 mg/dL — ABNORMAL HIGH (ref 0.50–1.35)
GFR, EST AFRICAN AMERICAN: 21 mL/min — AB (ref >90)
GFR, EST AFRICAN AMERICAN: 25 mL/min — AB (ref >90)
GFR, EST NON AFRICAN AMERICAN: 19 mL/min — AB (ref >90)
GFR, EST NON AFRICAN AMERICAN: 21 mL/min — AB (ref >90)
Glucose, Bld: 162 mg/dL — ABNORMAL HIGH (ref 70–99)
Glucose, Bld: 138 mg/dL — ABNORMAL HIGH (ref 70–99)
POTASSIUM: 4.3 meq/L (ref 3.7–5.3)
Potassium: 4.3 mEq/L (ref 3.7–5.3)
SODIUM: 139 meq/L (ref 137–147)
SODIUM: 139 meq/L (ref 137–147)

## 2013-07-14 LAB — POCT I-STAT 3, VENOUS BLOOD GAS (G3P V)
ACID-BASE EXCESS: 9 mmol/L — AB (ref 0.0–2.0)
ACID-BASE EXCESS: 9 mmol/L — AB (ref 0.0–2.0)
BICARBONATE: 35 meq/L — AB (ref 20.0–24.0)
Bicarbonate: 34.8 mEq/L — ABNORMAL HIGH (ref 20.0–24.0)
O2 SAT: 36 %
O2 SAT: 39 %
PO2 VEN: 22 mmHg — AB (ref 30.0–45.0)
TCO2: 37 mmol/L (ref 0–100)
TCO2: 37 mmol/L (ref 0–100)
pCO2, Ven: 56 mmHg — ABNORMAL HIGH (ref 45.0–50.0)
pCO2, Ven: 57.4 mmHg — ABNORMAL HIGH (ref 45.0–50.0)
pH, Ven: 7.394 — ABNORMAL HIGH (ref 7.250–7.300)
pH, Ven: 7.402 — ABNORMAL HIGH (ref 7.250–7.300)
pO2, Ven: 23 mmHg — CL (ref 30.0–45.0)

## 2013-07-14 LAB — CBC
HCT: 26.3 % — ABNORMAL LOW (ref 39.0–52.0)
HEMOGLOBIN: 8.1 g/dL — AB (ref 13.0–17.0)
MCH: 26.6 pg (ref 26.0–34.0)
MCHC: 30.8 g/dL (ref 30.0–36.0)
MCV: 86.2 fL (ref 78.0–100.0)
Platelets: 255 10*3/uL (ref 150–400)
RBC: 3.05 MIL/uL — AB (ref 4.22–5.81)
RDW: 15 % (ref 11.5–15.5)
WBC: 5.9 10*3/uL (ref 4.0–10.5)

## 2013-07-14 LAB — GLUCOSE, CAPILLARY
GLUCOSE-CAPILLARY: 165 mg/dL — AB (ref 70–99)
GLUCOSE-CAPILLARY: 118 mg/dL — AB (ref 70–99)
GLUCOSE-CAPILLARY: 138 mg/dL — AB (ref 70–99)
GLUCOSE-CAPILLARY: 198 mg/dL — AB (ref 70–99)
Glucose-Capillary: 124 mg/dL — ABNORMAL HIGH (ref 70–99)
Glucose-Capillary: 146 mg/dL — ABNORMAL HIGH (ref 70–99)

## 2013-07-14 LAB — MRSA PCR SCREENING: MRSA by PCR: NEGATIVE

## 2013-07-14 LAB — PROTIME-INR
INR: 1.17 (ref 0.00–1.49)
PROTHROMBIN TIME: 14.7 s (ref 11.6–15.2)

## 2013-07-14 LAB — PRO B NATRIURETIC PEPTIDE: Pro B Natriuretic peptide (BNP): 16662 pg/mL — ABNORMAL HIGH (ref 0–450)

## 2013-07-14 SURGERY — RIGHT HEART CATH
Anesthesia: LOCAL

## 2013-07-14 MED ORDER — SODIUM CHLORIDE 0.9 % IV SOLN
250.0000 mL | INTRAVENOUS | Status: DC | PRN
Start: 2013-07-14 — End: 2013-07-17
  Administered 2013-07-15: 250 mL via INTRAVENOUS
  Administered 2013-07-16: 11:00:00 via INTRAVENOUS

## 2013-07-14 MED ORDER — METOPROLOL SUCCINATE ER 25 MG PO TB24
25.0000 mg | ORAL_TABLET | Freq: Every day | ORAL | Status: DC
  Administered 2013-07-14: 25 mg via ORAL
  Filled 2013-07-14 (×3): qty 1

## 2013-07-14 MED ORDER — SODIUM CHLORIDE 0.9 % IJ SOLN: 3.0000 mL | INTRAMUSCULAR | Status: DC | PRN

## 2013-07-14 MED ORDER — HEPARIN (PORCINE) IN NACL 2-0.9 UNIT/ML-% IJ SOLN
INTRAMUSCULAR | Status: AC
  Filled 2013-07-14: qty 500

## 2013-07-14 MED ORDER — SODIUM CHLORIDE 0.9 % IJ SOLN
3.0000 mL | INTRAMUSCULAR | Status: DC | PRN
Start: 2013-07-14 — End: 2013-07-17

## 2013-07-14 MED ORDER — HEPARIN SODIUM (PORCINE) 5000 UNIT/ML IJ SOLN
5000.0000 [IU] | Freq: Three times a day (TID) | INTRAMUSCULAR | Status: DC
  Administered 2013-07-14 – 2013-07-17 (×8): 5000 [IU] via SUBCUTANEOUS
  Filled 2013-07-14 (×12): qty 1

## 2013-07-14 MED ORDER — SODIUM CHLORIDE 0.9 % IJ SOLN: 3.0000 mL | Freq: Two times a day (BID) | INTRAMUSCULAR | Status: DC

## 2013-07-14 MED ORDER — ONDANSETRON HCL 4 MG/2ML IJ SOLN
INTRAMUSCULAR | Status: AC
  Filled 2013-07-14: qty 2

## 2013-07-14 MED ORDER — MILRINONE IN DEXTROSE 20 MG/100ML IV SOLN
0.2500 ug/kg/min | INTRAVENOUS | Status: DC
  Administered 2013-07-15 (×2): 0.25 ug/kg/min via INTRAVENOUS
  Filled 2013-07-14 (×2): qty 100

## 2013-07-14 MED ORDER — ASPIRIN 81 MG PO CHEW: 81.0000 mg | CHEWABLE_TABLET | ORAL | Status: DC

## 2013-07-14 MED ORDER — LIDOCAINE HCL (PF) 1 % IJ SOLN
INTRAMUSCULAR | Status: AC
  Filled 2013-07-14: qty 30

## 2013-07-14 MED ORDER — MILRINONE IN DEXTROSE 20 MG/100ML IV SOLN
0.2500 ug/kg/min | INTRAVENOUS | Status: DC
  Administered 2013-07-14: 0.25 ug/kg/min via INTRAVENOUS
  Filled 2013-07-14 (×2): qty 100

## 2013-07-14 MED ORDER — SODIUM CHLORIDE 0.9 % IJ SOLN
3.0000 mL | Freq: Two times a day (BID) | INTRAMUSCULAR | Status: DC
  Administered 2013-07-14: 10 mL via INTRAVENOUS
  Administered 2013-07-15 – 2013-07-16 (×2): 3 mL via INTRAVENOUS

## 2013-07-14 MED ORDER — SODIUM CHLORIDE 0.9 % IV SOLN: 250.0000 mL | INTRAVENOUS | Status: DC | PRN

## 2013-07-14 MED ORDER — FUROSEMIDE 10 MG/ML IJ SOLN
80.0000 mg | Freq: Two times a day (BID) | INTRAMUSCULAR | Status: DC
  Administered 2013-07-14 – 2013-07-16 (×4): 80 mg via INTRAVENOUS
  Filled 2013-07-14 (×6): qty 8

## 2013-07-14 NOTE — Procedures (Addendum)
ELECTROENCEPHALOGRAM REPORT   Patient: Joseph Burgess       Room #: 0H47 EEG No. ID: [id number] Age: 80 y.o.        Sex: male Referring Physician: David Stall Report Date:  07/13/2013        Interpreting Physician: Thana Farr D  History: [name] is an 80 y.o. male with episodes of dizziness and near syncope evaluated to rule out seizure  Medications:  Scheduled: . aspirin EC  81 mg Oral QODAY  . carvedilol  3.125 mg Oral QPM  . heparin  5,000 Units Subcutaneous 3 times per day  . insulin pump   Subcutaneous TID AC, HS, 0200  . iron dextran (INFED/DEXFERRUM) infusion  500 mg Intravenous Daily  . levothyroxine  100 mcg Oral QAC breakfast  . midodrine  10 mg Oral TID WC  . pantoprazole  40 mg Oral BID AC  . simvastatin  10 mg Oral QHS  . sodium chloride  3 mL Intravenous Q12H    Conditions of Recording:  This is a 16 channel EEG carried out with the patient in the awake state.  Description:  The waking background activity consists of a low voltage, symmetrical, fairly well organized, 7-8 Hz alpha activity, seen from the parieto-occipital and posterior temporal regions.  This is not sustained throughout wakefulness adn often a slower posterior background rhythm is noted.  Low voltage fast activity, poorly organized, is seen anteriorly and is at times superimposed on more posterior regions.  A mixture of theta and alpha rhythms are seen from the central and temporal regions. The patient does not drowse or sleep. Hyperventilation was not performed.  Intermittent photic stimulation was performed but failed to illicit any change in the tracing.   IMPRESSION: This is an abnormal EEG secondary to posterior background slowing.  This finding may be seen with a diffuse gray matter disturbance that is etiologically nonspecific, but may include a dementia, among other possibilities.  The further slowing noted is likely representative of intervening normal drowse.  No epileptiform activity is noted.      Thana Farr, MD [facility] [phone] 07/13/2013, 8:13 PM

## 2013-07-14 NOTE — Progress Notes (Addendum)
[facility] PROGRESS NOTE Interim History: 80 y.o. male with past medical history of chronic systolic CHF, chronic ICM, EF 36-64%, CKD stage IV, long standing DM 1 on insulin pump who presented to [facility] ED 07/11/2013 with near syncope. He reported he has frequent near syncopal events and very often feels very unsteady on his feet. Pt was found to have troponin of 0.45, BNP of ~3,000, hemoglobin of 9.2, potassium of 3.6 and creatinine of 2.78. The 12 lead EKG showed no acute ischemic events. Additionally, his weight was up 8 pounds in 1 week period.  Filed Weights   07/12/13 0626 07/14/13 0406 07/14/13 0500  Weight: 79 kg (174 lb 2.6 oz) 87.544 kg (193 lb) 81.239 kg (179 lb 1.6 oz)        Intake/Output Summary (Last 24 hours) at 07/14/13 4034 Last data filed at 07/13/13 2017  Gross per 24 hour  Intake    730 ml  Output    200 ml  Net    530 ml     Assessment/Plan: Weakness, near syncope: - Perhaps secondary to orthostatic hypotension, BP's soft but not orthostatic on 3/16  - Continue midodrine 10 mg PO TID  - Will need PT eval prior to discharge  - Diuretics stopped due to orthostatic hypotension/hypotension  - Syncope (likely) overnight- BP 104/52 and CBG was 209. - EEG: abnormal EEG secondary to posterior background slowing  Diabetes type 1 with renal manifestations & hypoglycemia  - let pt manage his insulin pump. - Hbg A1c 7.4  Elevated troponin, CAD (coronary artery disease)  - Troponin trended down to 0.34 --> 0.33  - Appreciate cardiology consult and recommendations- EP consult for ICD if EF remains < 30-35%, IV iron and if Hb < 8 transfuse to atleast 9.  - ECHO: estimated ejection fraction was in the range of 10% to 15%. Dyskinesis of the entireanteroseptal myocardium. Akinesis. Severe hypokinesis of the lateral myocardium.  Acute on Chronic systolic heart failure  - 2 D ECHO in 2015 EF 10%, will need further diuresis. Will consult advance heart failure team. -  BNP ~ 3,000 on this admission  - 2 D Echo 07/13/2013: mild LVH, LVEF; 10-15% with Akinesis/hypokinesis of several areas. - EP consult per cardiology  - Continue coreg 3.125 mg BID   Acute on CKD (chronic kidney disease) stage 3  - Creatinine 2.78 on this admission; in 04/2013 creatinine was 1.7  - Lasix stopped  - Diuretics DC'ed.  - Not on ACEI/ARB due to renal failure. Likely due to diuretics/hemodyanamics/cardiorenal syndrome.  - Follow BMP in am.   Hypothyroidism  - continue levothyroxine   Hypokalemia/Hyperkalemia  - secondary to lasix  - overcorrected. DC K supplements  Dyslipidemia  - continue statin therapy   Anemia of chronic disease/CKD  - Secondary to CKD  - Hemoglobin 8.2  - No indications for transfusion currently. IV Iron   Code Status: Full code  Family Communication: no family at the bedside  Disposition Plan: home when stable    Consultants:  Cardiology  neurology  Procedures: ECHO: ef 1-%  Antibiotics:  none (indicate start date, and stop date if known)  HPI/Subjective: Feels tired & weak .  Objective: Filed Vitals:   07/14/13 0406 07/14/13 0500 07/14/13 0804 07/14/13 0935  BP: 114/58  116/80 88/52  Pulse: 74  80   Temp:   98.1 F (36.7 C)   TempSrc:   Oral   Resp:   17   Height:  Weight: 87.544 kg (193 lb) 81.239 kg (179 lb 1.6 oz)    SpO2:   98%      Exam:  General: Alert, awake, oriented x3, in no acute distress.  HEENT: No bruits, no goiter. +JVD Heart: Regular rate and rhythm + S3, 3+ lower ext dopplers. Lungs: Good air movement, crackles B/L at bases  Abdomen: Soft, nontender, nondistended, positive bowel sounds.    Data Reviewed: Basic Metabolic Panel:  Recent Labs Lab 07/11/13 1445 07/12/13 1019 07/13/13 0512 07/13/13 0930 07/14/13 0520  NA 137 135* 137 137 139  K 3.6* 3.8 6.1* 5.3 4.3  CL 88* 88* 89* 89* 93*  CO2 33* 32 34* 30 30  GLUCOSE 170* 255* 283* 256* 162*  BUN 70* 75* 80* 78* 76*   CREATININE 2.78* 2.75* 3.27* 3.13* 2.95*  CALCIUM 9.4 9.0 9.4 9.6 9.4   Liver Function Tests: No results found for this basename: AST, ALT, ALKPHOS, BILITOT, PROT, ALBUMIN,  in the last 168 hours No results found for this basename: LIPASE, AMYLASE,  in the last 168 hours No results found for this basename: AMMONIA,  in the last 168 hours CBC:  Recent Labs Lab 07/11/13 1445 07/13/13 0512 07/14/13 0520  WBC 5.2 6.0 5.9  NEUTROABS 3.5  --   --   HGB 9.2* 8.2* 8.1*  HCT 29.3* 26.7* 26.3*  MCV 86.7 85.3 86.2  PLT 300 252 255   Cardiac Enzymes:  Recent Labs Lab 07/11/13 1445 07/11/13 2055 07/12/13 0520 07/12/13 1019  TROPONINI 0.45* 0.34* 0.36* 0.33*   BNP (last 3 results)  Recent Labs  07/11/13 2055  PROBNP 30352.0*   CBG:  Recent Labs Lab 07/13/13 1702 07/13/13 1724 07/13/13 2059 07/14/13 0217 07/14/13 0808  GLUCAP 35* 80 169* 165* 124*    No results found for this or any previous visit (from the past 240 hour(s)).   Studies: No results found.  Scheduled Meds: . aspirin EC  81 mg Oral QODAY  . carvedilol  3.125 mg Oral QPM  . heparin  5,000 Units Subcutaneous 3 times per day  . insulin pump   Subcutaneous TID AC, HS, 0200  . iron dextran (INFED/DEXFERRUM) infusion  500 mg Intravenous Daily  . levothyroxine  100 mcg Oral QAC breakfast  . midodrine  10 mg Oral TID WC  . pantoprazole  40 mg Oral BID AC  . simvastatin  10 mg Oral QHS  . sodium chloride  3 mL Intravenous Q12H   Continuous Infusions:    Marinda ElkFELIZ ORTIZ, ABRAHAM  [facility] Pager [phone] If 8PM-8AM, please contact night-coverage at [email or web address], password [email or web address] 07/14/2013, 9:53 AM  LOS: 3 days

## 2013-07-14 NOTE — Progress Notes (Signed)
  Patient Profile: Joseph Burgess is an 83-y/o M with history of atrial fibrillation, hypertension, type 1 DM, and recurrent syncope/near syncope, whom we have been asked to see by the hospitalist service to assist with further evaluation of mild troponin elevation in the setting of recurrent syncope. The patient reports frequent near-syncopal episodes that began after he was hospitalized with a GI bleed complicated by NSTEMI and decompensated HF with LVEF 25-35% in 03/2013. He underwent myocardial perfusion stress test that revealed anterior and inferior fixed defects without ischemia. Due to renal insufficiency, coronary angiography was not pursued. The patient is no longer on Eliquis due to h/o GI bleeding. He was recently started on a low-dose aspirin every other day without obvious bleeding.   Since then, the patient reports feeling lightheaded at least 2-3 times per day. This typically occurs shortly after standing and lasts anywhere from 30 seconds to 2-3 minutes.  The patient has been diagnosed with orthostatic hypotension, for which he has been taking midodrine.   Subjective: He denies CP. No near syncope o/n.   Objective: Vital signs in last 24 hours: Temp:  [97.7 F (36.5 C)-98.9 F (37.2 C)] 98.1 F (36.7 C) (03/17 0804) Pulse Rate:  [67-112] 80 (03/17 0804) Resp:  [17-20] 17 (03/17 0804) BP: (88-125)/(52-80) 88/52 mmHg (03/17 0935) SpO2:  [97 %-100 %] 98 % (03/17 0804) Weight:  [179 lb 1.6 oz (81.239 kg)-193 lb (87.544 kg)] 179 lb 1.6 oz (81.239 kg) (03/17 0500) Last BM Date: 07/13/13  Intake/Output from previous day: 03/16 0701 - 03/17 0700 In: 970 [P.O.:720; IV Piggyback:250] Out: 200 [Urine:200] Intake/Output this shift:    Medications Current Facility-Administered Medications  Medication Dose Route Frequency Provider Last Rate Last Dose  . acetaminophen (TYLENOL) tablet 650 mg  650 mg Oral Q6H PRN Corinna L Sullivan, MD   650 mg at 07/11/13 2249   Or  . acetaminophen  (TYLENOL) suppository 650 mg  650 mg Rectal Q6H PRN Corinna L Sullivan, MD      . aspirin EC tablet 81 mg  81 mg Oral QODAY Christopher A. End, MD   81 mg at 07/13/13 1139  . heparin injection 5,000 Units  5,000 Units Subcutaneous 3 times per day Corinna L Sullivan, MD   5,000 Units at 07/12/13 2154  . insulin pump   Subcutaneous TID AC, HS, 0200 Alma M Devine, MD      . iron dextran complex (INFED) 500 mg in sodium chloride 0.9 % 250 mL IVPB  500 mg Intravenous Daily Kimberly Ballard Hammons, RPH   500 mg at 07/14/13 1026  . levothyroxine (SYNTHROID, LEVOTHROID) tablet 100 mcg  100 mcg Oral QAC breakfast Corinna L Sullivan, MD   100 mcg at 07/14/13 0651  . metoprolol succinate (TOPROL-XL) 24 hr tablet 25 mg  25 mg Oral QHS David W Harding, MD      . midodrine (PROAMATINE) tablet 10 mg  10 mg Oral TID WC Corinna L Sullivan, MD   10 mg at 07/14/13 0936  . ondansetron (ZOFRAN) tablet 4 mg  4 mg Oral Q6H PRN Corinna L Sullivan, MD   4 mg at 07/13/13 1142   Or  . ondansetron (ZOFRAN) injection 4 mg  4 mg Intravenous Q6H PRN Corinna L Sullivan, MD   4 mg at 07/12/13 2030  . pantoprazole (PROTONIX) EC tablet 40 mg  40 mg Oral BID AC Corinna L Sullivan, MD   40 mg at 07/14/13 0650  . senna-docusate (Senokot-S) tablet 1 tablet  1   tablet Oral QHS PRN Corinna L Sullivan, MD      . simvastatin (ZOCOR) tablet 10 mg  10 mg Oral QHS Corinna L Sullivan, MD   10 mg at 07/13/13 2153  . sodium chloride 0.9 % injection 3 mL  3 mL Intravenous Q12H Corinna L Sullivan, MD   3 mL at 07/14/13 0937    PE: General appearance: alert, cooperative and no distress Neck: no carotid bruit Lungs: clear to auscultation bilaterally Heart: regular rate and rhythm Extremities: Trace pitting bilateral LEE - compression stockings in place  Pulses: 2+ and symmetric Skin: warm and dry Neurologic: Grossly normal  Lab Results:   Recent Labs  07/11/13 1445 07/13/13 0512 07/14/13 0520  WBC 5.2 6.0 5.9  HGB 9.2* 8.2* 8.1*    HCT 29.3* 26.7* 26.3*  PLT 300 252 255   BMET  Recent Labs  07/13/13 0512 07/13/13 0930 07/14/13 0520  NA 137 137 139  K 6.1* 5.3 4.3  CL 89* 89* 93*  CO2 34* 30 30  GLUCOSE 283* 256* 162*  BUN 80* 78* 76*  CREATININE 3.27* 3.13* 2.95*  CALCIUM 9.4 9.6 9.4   Cardiac Panel (last 3 results)  Recent Labs  07/11/13 2055 07/12/13 0520 07/12/13 1019  TROPONINI 0.34* 0.36* 0.33*   Echo reviewed -- see Epic.  EF ~10-15 % with anterior & inferior WMA.  (suggests MV CAD)  Assessment/Plan  Principal Problem:   Near syncope Active Problems:   Diabetes type 1, controlled   Ischemic cardiomyopathy   Chronic systolic heart failure   Hypothyroidism   Insulin dependent diabetes mellitus with renal manifestation   Diabetic retinopathy   Elevated troponin   Hypokalemia   Unspecified hypothyroidism   Dyslipidemia   Renal failure, acute on chronic   Hyperkalemia   Systolic CHF, acute on chronic   Anemia  Plan: 80 y/o with what appears to be chronic ICM (unknown anatomy) EF 25-35% in 03/2013, PAD.  P/w SSx of dizziness & near syncope.  Troponin on initial eval was mildly elevated.  Noted 4 lb wgt gain. Prior to admission. After initial diuresis, remains orthostatic. --> diuretic stopped.  O midodrine.  Recommend compression stockings to help with edema redistribution. Troponin elevation likely not related to Ischemic presentation.  1. Near-syncope: Physical exam negative for carotid bruits. Telemetry shows no arrhthymias(during episode). Symptoms appear to be consistent with orthostatic hypotension.  Continue midodrine.  Agree with EP assessment - most likely Orthostatic in [place] -- do not like Midodrine (esp with very low EF) ---> will convert BB to Toprol 25 mg qhs.  2. Elevated troponin:  Simply from low EF -- ? If chronic mild ischemia. In setting of chronic anemia and CKD. His EKG on this admission demonstrates non specific ST abnormalities, also seen on prior EKGs. He  denies CP. His Hgb has dropped from 9.2 to 8.2 but stable -- IV Fe yesterday.  Transfuse if Hgb < 8.0.   3. Ischemic Cardiomyopathy: . Continue BB. No ACE/ARB secondary to CKD. He has 2+ bilateral pitting edema. Lasix is on hold due to problems with hypotension.   Continue with compression stockings.   EP consulted today for ICD (with narrow QRS - no BiV).  RHC planned for today to assess Cardiac Output & Index / filling pressures; unfortunately with CKD-4/5 concern for CIN is too high to consider Cor Angio  Recheck proBNP today (? If it can be checked from RHC line).  With MV CAD suggested by Cath - revascularization would potentially   be helpful, but I agree with the initial assessment of concern for CIN & ESRD from contrast -- with MV CAD, would not be a PCI candidate given recent GI bleed -- ? If CABG would be an option.   Pending RHC results - would consider c/s to Adv. CHF team for options / recommendations.  Consider possibility of Milrinone vs. Dobutamine. 4. Hyperkalemia:  Stable now.  5.  PAF - remains in NSR. (lots of artifact on monitor) 6. Anemia -- 1 run of 2 IV Fe; 2nd run pending (IV infiltrated)   HARDING,DAVID W, MD  

## 2013-07-14 NOTE — CV Procedure (Addendum)
Cardiac Cath Procedure Note:  Indication:   Procedures performed:  1) Right heart catheterization 2) Placement of RIJ triple lumen catheter  Description of procedure:   The risks and indication of the procedure were explained. Consent was signed and placed on the chart. An appropriate timeout was taken prior to the procedure. The right neck was prepped and draped in the routine sterile fashion and anesthetized with 1% local lidocaine.   A 7 FR venous sheath was placed in the right internal jugular vein using a modified Seldinger technique. A standard Swan-Ganz catheter was used for the procedure.   After the procedure the RIJ sheath was exchanged over a wire for a triple lumen venous catheter.   Complications: None apparent.  Findings:  RA =  19 RV =  68/16/24 PA = 66/34 (48) PCW = 35 (v-wave to 45) Fick cardiac output/index  = 3.9/2.0 Thermo CO/CI = 2.1/1.1 PVR = 6.4 (thermo) FA sat = 98% PA sat = 36%, 39% Ao cuff: 136/86 (105) SVR (thermo) = 3276  Assessment:  1. Severe biventricular HF with elevated filling pressures and cardiogenic shock physiology  Plan/Discussion:   Based on MV sat, suspect thermodilution more accurate than Fick. He has end-stage HF. Only option would be to consider trial of home inotropes verus palliative care. Will d/w patient, family and primary team.   Arvilla Meres 2:22 PM  Addendum: D/w patient and Dr. Ladona Ridgel. He is interested in trying in inotropic support. Will start milrinone and see how he does. Can switch to dobutamine if hypotension is issue.   Truman Hayward 2:43 PM

## 2013-07-14 NOTE — Interval H&P Note (Signed)
History and Physical Interval Note:  07/14/2013 1:47 PM  [facility]  has presented today for surgery, with the diagnosis of hf  The various methods of treatment have been discussed with the patient and family. After consideration of risks, benefits and other options for treatment, the patient has consented to  Procedure(s): RIGHT HEART CATH (N/A) as a surgical intervention .  The patient's history has been reviewed, patient examined, no change in status, stable for surgery.  I have reviewed the patient's chart and labs.  Questions were answered to the patient's satisfaction.     Daniel Bensimhon

## 2013-07-14 NOTE — H&P (View-Only) (Signed)
Patient Profile: Joseph Burgess is an 83-y/o M with history of atrial fibrillation, hypertension, type 1 DM, and recurrent syncope/near syncope, whom we have been asked to see by the hospitalist service to assist with further evaluation of mild troponin elevation in the setting of recurrent syncope. The patient reports frequent near-syncopal episodes that began after he was hospitalized with a GI bleed complicated by NSTEMI and decompensated HF with LVEF 25-35% in 03/2013. He underwent myocardial perfusion stress test that revealed anterior and inferior fixed defects without ischemia. Due to renal insufficiency, coronary angiography was not pursued. The patient is no longer on Eliquis due to h/o GI bleeding. He was recently started on a low-dose aspirin every other day without obvious bleeding.   Since then, the patient reports feeling lightheaded at least 2-3 times per day. This typically occurs shortly after standing and lasts anywhere from 30 seconds to 2-3 minutes.  The patient has been diagnosed with orthostatic hypotension, for which he has been taking midodrine.   Subjective: He denies CP. No near syncope o/n.   Objective: Vital signs in last 24 hours: Temp:  [97.7 F (36.5 C)-98.9 F (37.2 C)] 98.1 F (36.7 C) (03/17 0804) Pulse Rate:  [67-112] 80 (03/17 0804) Resp:  [17-20] 17 (03/17 0804) BP: (88-125)/(52-80) 88/52 mmHg (03/17 0935) SpO2:  [97 %-100 %] 98 % (03/17 0804) Weight:  [179 lb 1.6 oz (81.239 kg)-193 lb (87.544 kg)] 179 lb 1.6 oz (81.239 kg) (03/17 0500) Last BM Date: 07/13/13  Intake/Output from previous day: 03/16 0701 - 03/17 0700 In: 970 [P.O.:720; IV Piggyback:250] Out: 200 [Urine:200] Intake/Output this shift:    Medications Current Facility-Administered Medications  Medication Dose Route Frequency Provider Last Rate Last Dose  . acetaminophen (TYLENOL) tablet 650 mg  650 mg Oral Q6H PRN Christiane Ha, MD   650 mg at 07/11/13 2249   Or  . acetaminophen  (TYLENOL) suppository 650 mg  650 mg Rectal Q6H PRN Christiane Ha, MD      . aspirin EC tablet 81 mg  81 mg Oral QODAY Christopher A. End, MD   81 mg at 07/13/13 1139  . heparin injection 5,000 Units  5,000 Units Subcutaneous 3 times per day Christiane Ha, MD   5,000 Units at 07/12/13 2154  . insulin pump   Subcutaneous TID AC, HS, 0200 Alison Murray, MD      . iron dextran complex (INFED) 500 mg in sodium chloride 0.9 % 250 mL IVPB  500 mg Intravenous Daily Judie Bonus Hammons, RPH   500 mg at 07/14/13 1026  . levothyroxine (SYNTHROID, LEVOTHROID) tablet 100 mcg  100 mcg Oral QAC breakfast Christiane Ha, MD   100 mcg at 07/14/13 [id number]  . metoprolol succinate (TOPROL-XL) 24 hr tablet 25 mg  25 mg Oral QHS Marykay Lex, MD      . midodrine (PROAMATINE) tablet 10 mg  10 mg Oral TID WC Christiane Ha, MD   10 mg at 07/14/13 0936  . ondansetron (ZOFRAN) tablet 4 mg  4 mg Oral Q6H PRN Christiane Ha, MD   4 mg at 07/13/13 1142   Or  . ondansetron (ZOFRAN) injection 4 mg  4 mg Intravenous Q6H PRN Christiane Ha, MD   4 mg at 07/12/13 2030  . pantoprazole (PROTONIX) EC tablet 40 mg  40 mg Oral BID AC Christiane Ha, MD   40 mg at 07/14/13 0650  . senna-docusate (Senokot-S) tablet 1 tablet  1  tablet Oral QHS PRN Christiane Haorinna L Sullivan, MD      . simvastatin (ZOCOR) tablet 10 mg  10 mg Oral QHS Christiane Haorinna L Sullivan, MD   10 mg at 07/13/13 2153  . sodium chloride 0.9 % injection 3 mL  3 mL Intravenous Q12H Christiane Haorinna L Sullivan, MD   3 mL at 07/14/13 [id number]    PE: General appearance: alert, cooperative and no distress Neck: no carotid bruit Lungs: clear to auscultation bilaterally Heart: regular rate and rhythm Extremities: Trace pitting bilateral LEE - compression stockings in place  Pulses: 2+ and symmetric Skin: warm and dry Neurologic: Grossly normal  Lab Results:   Recent Labs  07/11/13 1445 07/13/13 0512 07/14/13 0520  WBC 5.2 6.0 5.9  HGB 9.2* 8.2* 8.1*    HCT 29.3* 26.7* 26.3*  PLT 300 252 255   BMET  Recent Labs  07/13/13 0512 07/13/13 0930 07/14/13 0520  NA 137 137 139  K 6.1* 5.3 4.3  CL 89* 89* 93*  CO2 34* 30 30  GLUCOSE 283* 256* 162*  BUN 80* 78* 76*  CREATININE 3.27* 3.13* 2.95*  CALCIUM 9.4 9.6 9.4   Cardiac Panel (last 3 results)  Recent Labs  07/11/13 2055 07/12/13 0520 07/12/13 1019  TROPONINI 0.34* 0.36* 0.33*   Echo reviewed -- see Epic.  EF ~10-15 % with anterior & inferior WMA.  (suggests MV CAD)  Assessment/Plan  Principal Problem:   Near syncope Active Problems:   Diabetes type 1, controlled   Ischemic cardiomyopathy   Chronic systolic heart failure   Hypothyroidism   Insulin dependent diabetes mellitus with renal manifestation   Diabetic retinopathy   Elevated troponin   Hypokalemia   Unspecified hypothyroidism   Dyslipidemia   Renal failure, acute on chronic   Hyperkalemia   Systolic CHF, acute on chronic   Anemia  Plan: 80 y/o with what appears to be chronic ICM (unknown anatomy) EF 25-35% in 03/2013, PAD.  P/Burgess SSx of dizziness & near syncope.  Troponin on initial eval was mildly elevated.  Noted 4 lb wgt gain. Prior to admission. After initial diuresis, remains orthostatic. --> diuretic stopped.  O midodrine.  Recommend compression stockings to help with edema redistribution. Troponin elevation likely not related to Ischemic presentation.  1. Near-syncope: Physical exam negative for carotid bruits. Telemetry shows no arrhthymias(during episode). Symptoms appear to be consistent with orthostatic hypotension.  Continue midodrine.  Agree with EP assessment - most likely Orthostatic in [place] -- do not like Midodrine (esp with very low EF) ---> will convert BB to Toprol 25 mg qhs.  2. Elevated troponin:  Simply from low EF -- ? If chronic mild ischemia. In setting of chronic anemia and CKD. His EKG on this admission demonstrates non specific ST abnormalities, also seen on prior EKGs. He  denies CP. His Hgb has dropped from 9.2 to 8.2 but stable -- IV Fe yesterday.  Transfuse if Hgb < 8.0.   3. Ischemic Cardiomyopathy: . Continue BB. No ACE/ARB secondary to CKD. He has 2+ bilateral pitting edema. Lasix is on hold due to problems with hypotension.   Continue with compression stockings.   EP consulted today for ICD (with narrow QRS - no BiV).  RHC planned for today to assess Cardiac Output & Index / filling pressures; unfortunately with CKD-4/5 concern for CIN is too high to consider Cor Angio  Recheck proBNP today (? If it can be checked from RHC line).  With MV CAD suggested by Cath - revascularization would potentially  be helpful, but I agree with the initial assessment of concern for CIN & ESRD from contrast -- with MV CAD, would not be a PCI candidate given recent GI bleed -- ? If CABG would be an option.   Pending RHC results - would consider c/s to Adv. CHF team for options / recommendations.  Consider possibility of Milrinone vs. Dobutamine. 4. Hyperkalemia:  Stable now.  5.  PAF - remains in NSR. (lots of artifact on monitor) 6. Anemia -- 1 run of 2 IV Fe; 2nd run pending (IV infiltrated)   Joseph Zimny W, MD

## 2013-07-15 DIAGNOSIS — R570 Cardiogenic shock: Secondary | ICD-10-CM

## 2013-07-15 LAB — CARBOXYHEMOGLOBIN
CARBOXYHEMOGLOBIN: 2.1 % — AB (ref 0.5–1.5)
Methemoglobin: 2 % — ABNORMAL HIGH (ref 0.0–1.5)
O2 Saturation: 56.1 %
Total hemoglobin: 7.6 g/dL — ABNORMAL LOW (ref 13.5–18.0)

## 2013-07-15 LAB — BASIC METABOLIC PANEL
BUN: 66 mg/dL — ABNORMAL HIGH (ref 6–23)
CALCIUM: 9.4 mg/dL (ref 8.4–10.5)
CO2: 34 mEq/L — ABNORMAL HIGH (ref 19–32)
CREATININE: 2.65 mg/dL — AB (ref 0.50–1.35)
Chloride: 94 mEq/L — ABNORMAL LOW (ref 96–112)
GFR calc non Af Amer: 21 mL/min — ABNORMAL LOW (ref >90)
GFR, EST AFRICAN AMERICAN: 24 mL/min — AB (ref >90)
Glucose, Bld: 146 mg/dL — ABNORMAL HIGH (ref 70–99)
Potassium: 3.9 mEq/L (ref 3.7–5.3)
Sodium: 142 mEq/L (ref 137–147)

## 2013-07-15 LAB — GLUCOSE, CAPILLARY
GLUCOSE-CAPILLARY: 184 mg/dL — AB (ref 70–99)
GLUCOSE-CAPILLARY: 168 mg/dL — AB (ref 70–99)
Glucose-Capillary: 53 mg/dL — ABNORMAL LOW (ref 70–99)
Glucose-Capillary: 54 mg/dL — ABNORMAL LOW (ref 70–99)
Glucose-Capillary: 86 mg/dL (ref 70–99)
Glucose-Capillary: 167 mg/dL — ABNORMAL HIGH (ref 70–99)
Glucose-Capillary: 130 mg/dL — ABNORMAL HIGH (ref 70–99)
Glucose-Capillary: 218 mg/dL — ABNORMAL HIGH (ref 70–99)

## 2013-07-15 MED ORDER — LIDOCAINE-EPINEPHRINE 1 %-1:100000 IJ SOLN
20.0000 mL | Freq: Once | INTRAMUSCULAR | Status: DC
  Filled 2013-07-15: qty 20

## 2013-07-15 MED ORDER — LIDOCAINE-EPINEPHRINE (PF) 1 %-1:200000 IJ SOLN
10.0000 mL | Freq: Once | INTRAMUSCULAR | Status: DC
  Filled 2013-07-15: qty 10

## 2013-07-15 MED ORDER — MIDODRINE HCL 5 MG PO TABS
5.0000 mg | ORAL_TABLET | Freq: Three times a day (TID) | ORAL | Status: DC
  Administered 2013-07-15 – 2013-07-16 (×5): 5 mg via ORAL
  Filled 2013-07-15 (×9): qty 1

## 2013-07-15 MED ORDER — ALUM & MAG HYDROXIDE-SIMETH 200-200-20 MG/5ML PO SUSP
30.0000 mL | ORAL | Status: DC | PRN
  Administered 2013-07-15: 30 mL via ORAL
  Filled 2013-07-15: qty 30

## 2013-07-15 MED ORDER — ZOLPIDEM TARTRATE 5 MG PO TABS
5.0000 mg | ORAL_TABLET | Freq: Every evening | ORAL | Status: DC | PRN
  Administered 2013-07-15: 5 mg via ORAL
  Filled 2013-07-15: qty 1

## 2013-07-15 NOTE — Progress Notes (Signed)
Dr. Gala Romney at bedside speaking with famity.  Re-informed him of right TLC leaking and redressing of site.  No new orders given.  Will continue to monitor and dress site prn.

## 2013-07-15 NOTE — Progress Notes (Signed)
[facility] PROGRESS NOTE Interim History: 80 y.o. male with past medical history of chronic systolic CHF, chronic ICM, EF 10-96%, CKD stage IV, long standing DM 1 on insulin pump who presented to [facility] ED 07/11/2013 with near syncope. He reported he has frequent near syncopal events and very often feels very unsteady on his feet. Pt was found to have troponin of 0.45, BNP of ~3,000, hemoglobin of 9.2, potassium of 3.6 and creatinine of 2.78. The 12 lead EKG showed no acute ischemic events. Additionally, his weight was up 8 pounds in 1 week period. Cardiology has been following and patient is now being seen by heart failure team. He underwent a right heart catheterization on 3/17 along with placement of triple lumen IJ catheter which revealed severe biventricular heart failure. Given this and his advanced age, options were discussed including comfort care. Patient is interested in trying inotropic support. Started on milrinone.  Filed Weights   07/14/13 0406 07/14/13 0500 07/15/13 0418  Weight: 87.544 kg (193 lb) 81.239 kg (179 lb 1.6 oz) 86.6 kg (190 lb 14.7 oz)        Intake/Output Summary (Last 24 hours) at 07/15/13 1302 Last data filed at 07/15/13 0454  Gross per 24 hour  Intake 607.28 ml  Output   1100 ml  Net -492.72 ml     Assessment/Plan: Weakness, near syncope:  - Continue midodrine 10 mg PO TID  - Will need PT eval prior to discharge  - Diuretics stopped due to orthostatic hypotension/hypotension  - Syncope (likely) overnight- BP 104/52 and CBG was 209. - EEG: abnormal EEG secondary to posterior background slowing Seen by electrophysiology. Syncope felt to be orthostasis related. Unable to tolerate ACE inhibitor because of renal insufficiency. Given narrow QRS, ICD implantation would not improve his left ventricular function. If pressure would tolerate, low dose of nitrate hydralazine would be ideal. Not candidate for systemic anticoagulation given prior GI bleed. Hospital  day amiodarone low dose may help sinus rhythm. BP will follow as outpatient to make final decision ICD implantation as he is a less than ideal candidate given her advanced age and comorbidities.  Diabetes type 1 with renal manifestations & hypoglycemia  - let pt manage his insulin pump. - Hbg A1c 7.4. Appreciate diabetic education.  Patient does experience some hypoglycemic events at home.  Elevated troponin, CAD (coronary artery disease)  - Troponin trended down to 0.34 --> 0.33  - Appreciate cardiology consult and recommendations- EP consult for ICD if EF remains < 30-35%, IV iron and if Hb < 8 transfuse to atleast 9.  - ECHO: estimated ejection fraction was in the range of 10% to 15%. Dyskinesis of the entireanteroseptal myocardium. Akinesis. Severe hypokinesis of the lateral myocardium.  Acute on Chronic systolic heart failure  - 2 D ECHO in 2015 EF 10%, will need further diuresis. Will consult advance heart failure team. - BNP ~ 3,000 on this admission  - 2 D Echo 07/13/2013: mild LVH, LVEF; 10-15% with Akinesis/hypokinesis of several areas. Currently on milrinone drip - Continue coreg 3.125 mg BID   Paroxysmal atrial fibrillation: Remains in normal sinus rhythm  Acute on CKD (chronic kidney disease) stage 3  - Creatinine stable currently at 2.65 for last 2 days. Elevated from baseline of 1.7 last month - Lasix stopped  - Diuretics DC'ed.  - Not on ACEI/ARB due to renal failure. Likely due to diuretics/hemodyanamics/cardiorenal syndrome.  - Follow daily basic metabolic panel   Hypothyroidism  - continue levothyroxine   Hypokalemia/Hyperkalemia  -  secondary to lasix  - overcorrected. DC K supplements  Dyslipidemia  - continue statin therapy   Anemia of chronic disease/CKD  - Secondary to CKD  - Hemoglobin 8.2  - No indications for transfusion currently. IV Iron   Code Status: Full code  Family Communication: Wife at the bedside Disposition Plan: home when  stable    Consultants:  Cardiology  neurology  Procedures: ECHO: ef 1-%  Antibiotics:  none (indicate start date, and stop date if known)  HPI/Subjective: Breathing about the same. Still very tired  Objective: Filed Vitals:   07/15/13 0418 07/15/13 0800 07/15/13 0818 07/15/13 1203  BP:   107/57 111/66  Pulse:  83 86   Temp: 98.3 F (36.8 C)  97.3 F (36.3 C) 97.8 F (36.6 C)  TempSrc: Oral  Oral Oral  Resp:  18 21 22   Height:      Weight: 86.6 kg (190 lb 14.7 oz)     SpO2:  93%       Exam:  General: Alert, awake, oriented x3, in no acute distress.  HEENT: No bruits, positive JVD Heart: Regular rate and rhythm + S3, 3+ lower ext dopplers. Lungs: Good air movement, crackles B/L at bases  Abdomen: Soft, nontender, nondistended, positive bowel sounds.    Data Reviewed: Basic Metabolic Panel:  Recent Labs Lab 07/13/13 0512 07/13/13 0930 07/14/13 0520 07/14/13 1900 07/15/13 0439  NA 137 137 139 139 142  K 6.1* 5.3 4.3 4.3 3.9  CL 89* 89* 93* 93* 94*  CO2 34* 30 30 31  34*  GLUCOSE 283* 256* 162* 138* 146*  BUN 80* 78* 76* 69* 66*  CREATININE 3.27* 3.13* 2.95* 2.64* 2.65*  CALCIUM 9.4 9.6 9.4 9.4 9.4   Liver Function Tests: No results found for this basename: AST, ALT, ALKPHOS, BILITOT, PROT, ALBUMIN,  in the last 168 hours No results found for this basename: LIPASE, AMYLASE,  in the last 168 hours No results found for this basename: AMMONIA,  in the last 168 hours CBC:  Recent Labs Lab 07/11/13 1445 07/13/13 0512 07/14/13 0520  WBC 5.2 6.0 5.9  NEUTROABS 3.5  --   --   HGB 9.2* 8.2* 8.1*  HCT 29.3* 26.7* 26.3*  MCV 86.7 85.3 86.2  PLT 300 252 255   Cardiac Enzymes:  Recent Labs Lab 07/11/13 1445 07/11/13 2055 07/12/13 0520 07/12/13 1019  TROPONINI 0.45* 0.34* 0.36* 0.33*   BNP (last 3 results)  Recent Labs  07/11/13 2055 07/14/13 0520  PROBNP 30352.0* 16662.0*   CBG:  Recent Labs Lab 07/15/13 0245 07/15/13 0307  07/15/13 0331 07/15/13 0621 07/15/13 0819  GLUCAP 53* 54* 86 167* 130*    Recent Results (from the past 240 hour(s))  MRSA PCR SCREENING     Status: None   Collection Time    07/14/13  6:02 PM      Result Value Ref Range Status   MRSA by PCR NEGATIVE  NEGATIVE Final   Comment:            The GeneXpert MRSA Assay (FDA     approved for NASAL specimens     only), is one component of a     comprehensive MRSA colonization     surveillance program. It is not     intended to diagnose MRSA     infection nor to guide or     monitor treatment for     MRSA infections.     Studies: No results found.  Scheduled Meds: .  aspirin EC  81 mg Oral QODAY  . furosemide  80 mg Intravenous BID  . heparin  5,000 Units Subcutaneous 3 times per day  . insulin pump   Subcutaneous TID AC, HS, 0200  . iron dextran (INFED/DEXFERRUM) infusion  500 mg Intravenous Daily  . levothyroxine  100 mcg Oral QAC breakfast  . midodrine  5 mg Oral TID WC  . pantoprazole  40 mg Oral BID AC  . simvastatin  10 mg Oral QHS  . sodium chloride  3 mL Intravenous Q12H  . sodium chloride  3 mL Intravenous Q12H   Continuous Infusions: . milrinone 0.25 mcg/kg/min (07/15/13 0250)     Hollice Espy  [facility] Pager [phone] If 8PM-8AM, please contact night-coverage at [email or web address], password [facility] 07/15/2013, 1:02 PM  LOS: 4 days   35 minutes

## 2013-07-15 NOTE — Progress Notes (Signed)
Subjective:  Joseph Burgess is an 83-y/o M with history of atrial fibrillation, hypertension, type 1 DM, recurrent syncope/near syncope, CKD IV (baseline cr 2.5-2.6), recent GIB, CAD and end-stage CHF due to ischemic CM with EF 10-15%. He was admitted with recurrent syncope thought to be due to orthostatic hypotension. Started on midodrine in January 2015.   Underwent RHC on 3/17 which showed elevated filling pressures  RA = 19  RV = 68/16/24  PA = 66/34 (48)  PCW = 35 (v-wave to 45)  Fick cardiac output/index = 3.9/2.0  Thermo CO/CI = 2.1/1.1  PVR = 6.4 (thermo)  FA sat = 98%  PA sat = 36%, 39%  Ao cuff: 136/86 (105)  SVR (thermo) = 3276  Started on milrinone and IV lasix. I/Os essentially even. Feels better. Co-ox improved to 56%. Having trouble with hypoglycemia. SBP 95-105.   Objective: Vital signs in last 24 hours: Temp:  [97.3 F (36.3 C)-98.4 F (36.9 C)] 97.3 F (36.3 C) (03/18 0818) Pulse Rate:  [72-102] 86 (03/18 0818) Resp:  [18-31] 21 (03/18 0818) BP: (96-149)/(53-86) 107/57 mmHg (03/18 0818) SpO2:  [92 %-100 %] 93 % (03/18 0800) Weight:  [86.6 kg (190 lb 14.7 oz)] 86.6 kg (190 lb 14.7 oz) (03/18 0418) Last BM Date: 07/13/13  Intake/Output from previous day: 03/17 0701 - 03/18 0700 In: 826.2 [P.O.:310; I.V.:256.2; IV Piggyback:260] Out: 1100 [Urine:1100] Intake/Output this shift: Total I/O In: 41.1 [I.V.:41.1] Out: -   Medications Current Facility-Administered Medications  Medication Dose Route Frequency Provider Last Rate Last Dose  . 0.9 %  sodium chloride infusion  250 mL Intravenous PRN Dolores Patty, MD 5 mL/hr at 07/14/13 2300 250 mL at 07/14/13 2300  . acetaminophen (TYLENOL) tablet 650 mg  650 mg Oral Q6H PRN Christiane Ha, MD   650 mg at 07/11/13 2249   Or  . acetaminophen (TYLENOL) suppository 650 mg  650 mg Rectal Q6H PRN Christiane Ha, MD      . aspirin EC tablet 81 mg  81 mg Oral QODAY Si Raider End, MD   81 mg at 07/15/13  0923  . furosemide (LASIX) injection 80 mg  80 mg Intravenous BID Dolores Patty, MD   80 mg at 07/15/13 2458  . heparin injection 5,000 Units  5,000 Units Subcutaneous 3 times per day Dolores Patty, MD   5,000 Units at 07/15/13 [phone]  . insulin pump   Subcutaneous TID AC, HS, 0200 Alison Murray, MD      . iron dextran complex (INFED) 500 mg in sodium chloride 0.9 % 250 mL IVPB  500 mg Intravenous Daily Judie Bonus Hammons, RPH   500 mg at 07/14/13 1026  . levothyroxine (SYNTHROID, LEVOTHROID) tablet 100 mcg  100 mcg Oral QAC breakfast Christiane Ha, MD   100 mcg at 07/15/13 3382  . metoprolol succinate (TOPROL-XL) 24 hr tablet 25 mg  25 mg Oral QHS Marykay Lex, MD   25 mg at 07/14/13 2259  . midodrine (PROAMATINE) tablet 10 mg  10 mg Oral TID WC Christiane Ha, MD   10 mg at 07/15/13 5053  . milrinone (PRIMACOR) infusion 200 mcg/mL (0.2 mg/ml)  0.25 mcg/kg/min Intravenous Continuous Dolores Patty, MD 6.1 mL/hr at 07/15/13 0250 0.25 mcg/kg/min at 07/15/13 0250  . ondansetron (ZOFRAN) tablet 4 mg  4 mg Oral Q6H PRN Christiane Ha, MD   4 mg at 07/13/13 1142   Or  . ondansetron [facility]) injection 4 mg  4 mg Intravenous Q6H PRN Christiane Ha, MD   4 mg at 07/15/13 1048  . pantoprazole (PROTONIX) EC tablet 40 mg  40 mg Oral BID AC Christiane Ha, MD   40 mg at 07/15/13 0630  . senna-docusate (Senokot-S) tablet 1 tablet  1 tablet Oral QHS PRN Christiane Ha, MD      . simvastatin (ZOCOR) tablet 10 mg  10 mg Oral QHS Christiane Ha, MD   10 mg at 07/14/13 2259  . sodium chloride 0.9 % injection 3 mL  3 mL Intravenous Q12H Christiane Ha, MD   3 mL at 07/15/13 1000  . sodium chloride 0.9 % injection 3 mL  3 mL Intravenous Q12H Dolores Patty, MD   3 mL at 07/15/13 1000  . sodium chloride 0.9 % injection 3 mL  3 mL Intravenous PRN Dolores Patty, MD        PE: CVP 11-12 General:  Elderly. Chronically ill-appearing No resp  difficulty HEENT: normal Neck: supple. RIJ TLC. Carotids 2+ bilat; no bruits. No lymphadenopathy or thryomegaly appreciated. Cor: PMI nondisplaced. Regular rate & rhythm. +s3 Lungs: clear Abdomen: soft, nontender, nondistended. No hepatosplenomegaly. No bruits or masses. Good bowel sounds. Extremities: no cyanosis, clubbing, rash, 1+ edema Neuro: alert & orientedx3, cranial nerves grossly intact. moves all 4 extremities w/o difficulty. Affect pleasant   Lab Results:   Recent Labs  07/13/13 0512 07/14/13 0520  WBC 6.0 5.9  HGB 8.2* 8.1*  HCT 26.7* 26.3*  PLT 252 255   BMET  Recent Labs  07/14/13 0520 07/14/13 1900 07/15/13 0439  NA 139 139 142  K 4.3 4.3 3.9  CL 93* 93* 94*  CO2 30 31 34*  GLUCOSE 162* 138* 146*  BUN 76* 69* 66*  CREATININE 2.95* 2.64* 2.65*  CALCIUM 9.4 9.4 9.4   Cardiac Panel (last 3 results) No results found for this basename: CKTOTAL, CKMB, TROPONINI, RELINDX,  in the last 72 hours  Assessment:  1. A/c systolic HF/cardiogenic shock 2. iCM EF 10% 3. DM2 (x . 60 years) with hypoglycemia  4. Orthostatic hypotension 5. Acute on CKD, stage IV - improving 6.  Limited code. No CPR. Defib, intubation and pressors OK.  Plan/Discussion:  Extensive discussion about his situation with him and his wife. In talking with him (and Dr. Ladona Ridgel), his main limitation seems to have clearly been orthostatic hypotension and syncope. Currently it is unclear to me whether this could be due to low output HF or could he also have severe orthostasis in setting of long-term DM2 and related autonomic dysfunction. I explained to them that these two process work directly against each other as one requires afterload reduction and the other requires afterload augmentation. If he does in fact have severe orthostasis then I told him it would be unlikely that would be able to treat his HF effectively and Hospice care may be only answer. However, it is possible that syncope may be  related to low-output HF and if so milrinone (or dobutamine) may be helpful. We will plan to wean midodrine slowly and continue milrinone and see how he responds. Will stop b-blocker.  We discussed his DM2 and suggested that given his comorbidities I would prefer to loosen up on his diabetic control to avoid hypoglycemia. CBGs of 200-300 are tolerable. Appreciate DM teaching team's care and input.   We also discussed his code status. He desires limited code. No CPR. Defib, intubation and pressors OK. Orders written.  Daniel Bensimhon,MD 11:55 AM

## 2013-07-15 NOTE — Progress Notes (Signed)
Physical Therapy Treatment Patient Details Name: Joseph Burgess MRN: [id number] DOB: 1929-06-13 Today's Date: 07/15/2013 Time: [id number] PT Time Calculation (min): 19 min  PT Assessment / Plan / Recommendation  History of Present Illness Patient is an 80 yo male who came to ED with multiple syncopal/near syncopal spells over the past few days.  Has h/o orthostatic hypotension on midodrine, systolic CHF, CAD, CKD, insulin dependent DM on insulin pod, which is a tubeless pump.  Spells occur when standing. Last a few seconds. Patient never loses consiousness, no postictal period.   PT Comments   Pt progressing towards goals, increased pace and stability with ambulation. Experienced DOE last 5750' and O2 replaced after entering room. Pt with left foot drop, more pronounced with fatigue. Continue to recommend HHPT, acute PT will continue to follow.    Follow Up Recommendations  Home health PT;Supervision/Assistance - 24 hour (HHaide recommended)     Does the patient have the potential to tolerate intense rehabilitation     Barriers to Discharge        Equipment Recommendations  None recommended by PT    Recommendations for Other Services    Frequency Min 3X/week   Progress towards PT Goals Progress towards PT goals: Progressing toward goals  Plan Current plan remains appropriate    Precautions / Restrictions Precautions Precautions: Fall Precaution Comments: Has had a fall during episode of weakness at home. Restrictions Weight Bearing Restrictions: No Other Position/Activity Restrictions: does better with shoes for gait   Pertinent Vitals/Pain VSS    Mobility  Bed Mobility Overal bed mobility: Modified Independent Transfers Overall transfer level: Needs assistance Equipment used: Rolling walker (2 wheeled) Transfers: Sit to/from Stand Sit to Stand: Supervision General transfer comment: pt sits and stands multiple times with safe hand placement, no LOB, and no physical  assist Ambulation/Gait Ambulation/Gait assistance: Min guard Ambulation Distance (Feet): 200 Feet Assistive device: Rolling walker (2 wheeled) Gait Pattern/deviations: Step-through pattern;Decreased step length - right;Decreased step length - left;Steppage Gait velocity: Slow Gait velocity interpretation: Below normal speed for age/gender General Gait Details: pt tolerated gait better today, continues with steppage on left due to foot drop from old back injury and surgery. Is more pronounced as he fatigues. Pt ambulated off O2 but DOE 2/4 after 200' and O2 replaced. Pace increased today.    Exercises General Exercises - Lower Extremity Ankle Circles/Pumps: AROM;Both;10 reps;Seated Other Exercises Other Exercises: reviewed LE exercises that he has been doing at home and instructed him to continue in hospital   PT Diagnosis:    PT Problem List:   PT Treatment Interventions:     PT Goals (current goals can now be found in the care plan section) Acute Rehab PT Goals Patient Stated Goal: To go home PT Goal Formulation: With patient Time For Goal Achievement: 07/19/13 Potential to Achieve Goals: Good  Visit Information  Last PT Received On: 07/15/13 Assistance Needed: +1 History of Present Illness: Patient is an 80 yo male who came to ED with multiple syncopal/near syncopal spells over the past few days.  Has h/o orthostatic hypotension on midodrine, systolic CHF, CAD, CKD, insulin dependent DM on insulin pod, which is a tubeless pump.  Spells occur when standing. Last a few seconds. Patient never loses consiousness, no postictal period.    Subjective Data  Subjective: pt agreeable to ambulation Patient Stated Goal: To go home   Cognition  Cognition Arousal/Alertness: Awake/alert Behavior During Therapy: WFL for tasks assessed/performed Overall Cognitive Status: Within Functional Limits for tasks assessed  Balance  Balance Overall balance assessment: Needs assistance Standing  balance support: No upper extremity supported;During functional activity Standing balance-Leahy Scale: Fair  End of Session PT - End of Session Equipment Utilized During Treatment: Gait belt Activity Tolerance: Patient tolerated treatment well Patient left: in bed;with call bell/phone within reach Nurse Communication: Mobility status   GP   Lyanne Co, PT  Acute Rehab Services  [phone]   Lyanne Co 07/15/2013, 4:09 PM

## 2013-07-15 NOTE — Progress Notes (Signed)
Informed Dr. Gala Romney of leaking right TLC.  No new orders given.  Will continue to monitor and redress site as needed.

## 2013-07-15 NOTE — Progress Notes (Signed)
Inpatient Diabetes Program Recommendations  AACE/ADA: New Consensus Statement on Inpatient Glycemic Control (2013)  Target Ranges:  Prepandial:   less than 140 mg/dL      Peak postprandial:   less than 180 mg/dL (1-2 hours)      Critically ill patients:  140 - 180 mg/dL   Reason for Visit: Insulin pump.  Spoke to patient regarding hypoglycemia events.  He states that he usually has a low every 2-3 days at home.  He usually treats CBG at night if greater than 160 mg/dL with insulin bolus.  Discussed with patient and wife importance of avoiding hypoglycemia, especially in the presence of heart failure.  Explained that low blood glucose puts extra stress on the heart and that his glucose goals will likely need to be higher in order to avoid lows.  Also discussed with Dr. Gala Romney.  Beryl Meager, RN, BC-ADM Inpatient Diabetes Coordinator Pager [phone]

## 2013-07-16 ENCOUNTER — Inpatient Hospital Stay (HOSPITAL_COMMUNITY): Payer: Medicare Other

## 2013-07-16 LAB — CBC
HCT: 26.5 % — ABNORMAL LOW (ref 39.0–52.0)
Hemoglobin: 8.1 g/dL — ABNORMAL LOW (ref 13.0–17.0)
MCH: 26.3 pg (ref 26.0–34.0)
MCHC: 30.6 g/dL (ref 30.0–36.0)
MCV: 86 fL (ref 78.0–100.0)
PLATELETS: 308 10*3/uL (ref 150–400)
RBC: 3.08 MIL/uL — AB (ref 4.22–5.81)
RDW: 15.4 % (ref 11.5–15.5)
WBC: 9.1 10*3/uL (ref 4.0–10.5)

## 2013-07-16 LAB — MAGNESIUM: Magnesium: 1.9 mg/dL (ref 1.5–2.5)

## 2013-07-16 LAB — GLUCOSE, CAPILLARY
GLUCOSE-CAPILLARY: 285 mg/dL — AB (ref 70–99)
GLUCOSE-CAPILLARY: 237 mg/dL — AB (ref 70–99)
GLUCOSE-CAPILLARY: 124 mg/dL — AB (ref 70–99)
Glucose-Capillary: 264 mg/dL — ABNORMAL HIGH (ref 70–99)
Glucose-Capillary: 324 mg/dL — ABNORMAL HIGH (ref 70–99)
Glucose-Capillary: 122 mg/dL — ABNORMAL HIGH (ref 70–99)
Glucose-Capillary: 126 mg/dL — ABNORMAL HIGH (ref 70–99)

## 2013-07-16 LAB — BASIC METABOLIC PANEL
BUN: 67 mg/dL — ABNORMAL HIGH (ref 6–23)
CHLORIDE: 90 meq/L — AB (ref 96–112)
CO2: 28 meq/L (ref 19–32)
Calcium: 9.7 mg/dL (ref 8.4–10.5)
Creatinine, Ser: 3.03 mg/dL — ABNORMAL HIGH (ref 0.50–1.35)
GFR calc Af Amer: 21 mL/min — ABNORMAL LOW (ref >90)
GFR calc non Af Amer: 18 mL/min — ABNORMAL LOW (ref >90)
Glucose, Bld: 316 mg/dL — ABNORMAL HIGH (ref 70–99)
POTASSIUM: 4.6 meq/L (ref 3.7–5.3)
SODIUM: 137 meq/L (ref 137–147)

## 2013-07-16 LAB — CARBOXYHEMOGLOBIN
Carboxyhemoglobin: 0.3 % — ABNORMAL LOW (ref 0.5–1.5)
Carboxyhemoglobin: 1.9 % — ABNORMAL HIGH (ref 0.5–1.5)
Carboxyhemoglobin: 0.7 % (ref 0.5–1.5)
METHEMOGLOBIN: 2.1 % — AB (ref 0.0–1.5)
METHEMOGLOBIN: 2.3 % — AB (ref 0.0–1.5)
Methemoglobin: 2.4 % — ABNORMAL HIGH (ref 0.0–1.5)
O2 SAT: 38.1 %
O2 SAT: 43.8 %
O2 Saturation: 49.3 %
TOTAL HEMOGLOBIN: 8.4 g/dL — AB (ref 13.5–18.0)
TOTAL HEMOGLOBIN: 7.5 g/dL — AB (ref 13.5–18.0)
Total hemoglobin: 7.9 g/dL — ABNORMAL LOW (ref 13.5–18.0)

## 2013-07-16 MED ORDER — INSULIN DETEMIR 100 UNIT/ML ~~LOC~~ SOLN
10.0000 [IU] | Freq: Every day | SUBCUTANEOUS | Status: DC
  Administered 2013-07-16: 10 [IU] via SUBCUTANEOUS
  Filled 2013-07-16 (×2): qty 0.1

## 2013-07-16 MED ORDER — INSULIN ASPART 100 UNIT/ML ~~LOC~~ SOLN
0.0000 [IU] | Freq: Three times a day (TID) | SUBCUTANEOUS | Status: DC
  Administered 2013-07-16: 3 [IU] via SUBCUTANEOUS

## 2013-07-16 MED ORDER — DOBUTAMINE IN D5W 4-5 MG/ML-% IV SOLN
5.0000 ug/kg/min | INTRAVENOUS | Status: DC
  Administered 2013-07-16: 5 ug/kg/min via INTRAVENOUS
  Filled 2013-07-16: qty 250

## 2013-07-16 MED ORDER — INSULIN ASPART 100 UNIT/ML ~~LOC~~ SOLN
0.0000 [IU] | SUBCUTANEOUS | Status: DC
Start: 2013-07-16 — End: 2013-07-16
  Administered 2013-07-16: 7 [IU] via SUBCUTANEOUS

## 2013-07-16 MED ORDER — INSULIN ASPART 100 UNIT/ML ~~LOC~~ SOLN: 0.0000 [IU] | Freq: Every day | SUBCUTANEOUS | Status: DC

## 2013-07-16 NOTE — Progress Notes (Signed)
Thank you to Dr Jacky Kindle for this referral via the long length of stay committee.  Chart review complete.  Patient is not eligible for [facility] Care Management services because his/her PCP Einar Crow)  is not a [facility] primary care provider or is not [facility] affiliated.  For any additional questions or new referrals please contact Anibal Henderson BSN RN [facility] Liaison at [phone]

## 2013-07-16 NOTE — Progress Notes (Signed)
Patient c/o increased nausea, indigestion and dry heaving. Family at the bedside, asking for something for relief. Zofran not due at this time (too soon), no other prn meds ordered. Spoke to dr. Sarita Bottom - on call cardiology physician, who ordered for mylanta prn. Administered mylanta and pt. Stated some relief soon after.  Pt has is much quieter tonight, compared to last night, with flat effect. Family asking for sleeping pill, to be administered. I advised them to wait a little for the nausea go pass.  Jorge Ny [place]

## 2013-07-16 NOTE — Progress Notes (Signed)
Patient had received ambient for sleep per family's request close to midnight last night. He was initially sleeping lightly, but at this time he started to get agitated, restless and pulling stuff off his chest. He was confused to time, place and situation, although he knew his name and DOB. After getting him to bed, he had a very short (up to 10 second) episode of "seizure-like activity" with rolling his eyes back and not responding. His HR also went fast from low 100s to low 70s. After this event, he was able to follow commands appropriately and his VS were back to his normal. There was no facial droop, no difference in bilateral strength, but he was still confused. NP for primary care -hospitalist called and suggested wrist restrains. I applied mitts-worried about increased anxiety and agitation with restraining. Patient tried multiple times to get up and pulled his monitor off several times. RN called daughter to come stay with the patient, because of high risk for fall with this acute confusion. Monitoring closely.  Joseph Burgess

## 2013-07-16 NOTE — Progress Notes (Signed)
Central line dressing changed third time this shift, due to large amounts of serous drainage. Gauze used under the dressing to eliminate some of the drainage. MD was made aware about the leak yesterday.  Patient seems more appropriate this morning, being oriented to time, self, and situation, still does not know the place, but does not pull off equipment of tries to get out of bet w/o assistance.  Jorge Ny [place]

## 2013-07-16 NOTE — Progress Notes (Signed)
Spoke to MD and NP.  Patient confused.  Insulin pod removed.  Sensitive correction ordered.  Note that patient's basal rates were 0.5 units/hr (total basal=12 units/24 hours). Consider adding Levemir 10 units daily for basal rate.  Wife understands that CBG's levels will likely be higher and that goals are not tight.    Beryl Meager, RN, BC-ADM Inpatient Diabetes Coordinator Pager [phone]

## 2013-07-16 NOTE — Progress Notes (Signed)
CBG this am is 323.  Patient is confused and unable to work insulin pod device.  MD called.

## 2013-07-16 NOTE — Progress Notes (Signed)
Subjective:  [facility] is an 83-y/o M with history of atrial fibrillation, hypertension, type 1 DM, recurrent syncope/near syncope, CKD IV (baseline cr 2.5-2.6), recent GIB, CAD and end-stage CHF due to ischemic CM with EF 10-15%. He was admitted with recurrent syncope thought to be due to orthostatic hypotension. Started on midodrine in January 2015.   Underwent RHC on 3/17 which showed elevated filling pressures  RA = 19  RV = 68/16/24  PA = 66/34 (48)  PCW = 35 (v-wave to 45)  Fick cardiac output/index = 3.9/2.0  Thermo CO/CI = 2.1/1.1  PVR = 6.4 (thermo)  FA sat = 98%  PA sat = 36%, 39%  Ao cuff: 136/86 (105)  SVR (thermo) = 3276  Much worse today. Got ambien last night and is confused. Feels weaker. Co-ox back down to 38%. Renal function worse with poor urine output. Continue to ooze cloudy fluid from central line site.   Objective: Vital signs in last 24 hours: Temp:  [97 F (36.1 C)-98.7 F (37.1 C)] 97 F (36.1 C) (03/19 1056) Pulse Rate:  [91-105] 98 (03/19 1100) Resp:  [20-95] 20 (03/19 1056) BP: (86-132)/(51-80) 118/80 mmHg (03/19 1100) SpO2:  [99 %-100 %] 100 % (03/19 1100) Weight:  [191 lb 12.8 oz (87 kg)] 191 lb 12.8 oz (87 kg) (03/19 0500) Last BM Date: 07/13/13  Intake/Output from previous day: 03/18 0701 - 03/19 0700 In: 904.2 [P.O.:370; I.V.:274.2; IV Piggyback:260] Out: 600 [Urine:600] Intake/Output this shift: Total I/O In: 413.3 [P.O.:340; I.V.:73.3] Out: 425 [Urine:425]  Medications Current Facility-Administered Medications  Medication Dose Route Frequency Provider Last Rate Last Dose  . 0.9 %  sodium chloride infusion  250 mL Intravenous PRN Dolores Patty, MD 10 mL/hr at 07/16/13 1120    . acetaminophen (TYLENOL) tablet 650 mg  650 mg Oral Q6H PRN Christiane Ha, MD   650 mg at 07/11/13 2249   Or  . acetaminophen (TYLENOL) suppository 650 mg  650 mg Rectal Q6H PRN Christiane Ha, MD      . alum & mag hydroxide-simeth  (MAALOX/MYLANTA) 200-200-20 MG/5ML suspension 30 mL  30 mL Oral Q4H PRN Ardis Rowan, MD   30 mL at 07/15/13 2231  . aspirin EC tablet 81 mg  81 mg Oral QODAY Si Raider End, MD   81 mg at 07/15/13 0923  . DOBUTamine (DOBUTREX) infusion 4000 mcg/mL  5 mcg/kg/min Intravenous Titrated Dolores Patty, MD 6.5 mL/hr at 07/16/13 1119 5 mcg/kg/min at 07/16/13 1119  . heparin injection 5,000 Units  5,000 Units Subcutaneous 3 times per day Dolores Patty, MD   5,000 Units at 07/16/13 0615  . insulin aspart (novoLOG) injection 0-5 Units  0-5 Units Subcutaneous QHS Hollice Espy, MD      . insulin aspart (novoLOG) injection 0-9 Units  0-9 Units Subcutaneous TID WC Hollice Espy, MD      . insulin detemir (LEVEMIR) injection 10 Units  10 Units Subcutaneous Daily Hollice Espy, MD   10 Units at 07/16/13 1237  . levothyroxine (SYNTHROID, LEVOTHROID) tablet 100 mcg  100 mcg Oral QAC breakfast Christiane Ha, MD   100 mcg at 07/16/13 0800  . lidocaine-EPINEPHrine (XYLOCAINE W/EPI) 1 %-1:100000 (with pres) injection 20 mL  20 mL Other Once Dolores Patty, MD      . midodrine (PROAMATINE) tablet 5 mg  5 mg Oral TID WC Hollice Espy, MD   5 mg at 07/16/13 1237  . ondansetron (ZOFRAN) tablet 4  mg  4 mg Oral Q6H PRN Christiane Haorinna L Sullivan, MD   4 mg at 07/13/13 1142   Or  . ondansetron (ZOFRAN) injection 4 mg  4 mg Intravenous Q6H PRN Christiane Haorinna L Sullivan, MD   4 mg at 07/15/13 1732  . pantoprazole (PROTONIX) EC tablet 40 mg  40 mg Oral BID AC Christiane Haorinna L Sullivan, MD   40 mg at 07/16/13 0800  . senna-docusate (Senokot-S) tablet 1 tablet  1 tablet Oral QHS PRN Christiane Haorinna L Sullivan, MD      . simvastatin (ZOCOR) tablet 10 mg  10 mg Oral QHS Christiane Haorinna L Sullivan, MD   10 mg at 07/15/13 2308  . sodium chloride 0.9 % injection 3 mL  3 mL Intravenous Q12H Christiane Haorinna L Sullivan, MD   3 mL at 07/16/13 1121  . sodium chloride 0.9 % injection 3 mL  3 mL Intravenous Q12H Dolores Pattyaniel R Farra Nikolic, MD   3 mL at  07/16/13 1120  . sodium chloride 0.9 % injection 3 mL  3 mL Intravenous PRN Dolores Pattyaniel R Aspyn Warnke, MD        PE: CVP 15 General:  Elderly. Chronically ill-appearing No resp difficulty HEENT: normal Neck: supple. RIJ TLC. Carotids 2+ bilat; no bruits. No lymphadenopathy or thryomegaly appreciated. Cor: PMI nondisplaced. Regular rate & rhythm. +s3 Lungs: clear Abdomen: soft, nontender, nondistended. No hepatosplenomegaly. No bruits or masses. Good bowel sounds. Extremities: no cyanosis, clubbing, rash, 1+ edema Neuro: alert but confused, cranial nerves grossly intact. moves all 4 extremities w/o difficulty. Affect pleasant   Lab Results:   Recent Labs  07/14/13 0520 07/16/13 0245  WBC 5.9 9.1  HGB 8.1* 8.1*  HCT 26.3* 26.5*  PLT 255 308   BMET  Recent Labs  07/14/13 1900 07/15/13 0439 07/16/13 0245  NA 139 142 137  K 4.3 3.9 4.6  CL 93* 94* 90*  CO2 31 34* 28  GLUCOSE 138* 146* 316*  BUN 69* 66* 67*  CREATININE 2.64* 2.65* 3.03*  CALCIUM 9.4 9.4 9.7   Cardiac Panel (last 3 results) No results found for this basename: CKTOTAL, CKMB, TROPONINI, RELINDX,  in the last 72 hours  Assessment:  1. A/c systolic HF/cardiogenic shock 2. iCM EF 10% 3. DM2 (x . 60 years) with hypoglycemia  4. Orthostatic hypotension 5. Acute on CKD, stage IV  6.  Limited code. No CPR. Defib, intubation and pressors OK.  Plan/Discussion:  He is deteriorating again with worsening cardiogenic shock. I think we are in a terminal situation. Will make one last effort to switch milrinone to dobutamine and see if any benefit.  His wife approached me and said that he felt that he was ready to die and she didn't want to see him suffer. Clarified code status to be.   We also discussed his code status. He desires limited code. No CPR. No intubation.  Pressors OK. Orders written.   Will recheck co-ox after dobutamine and if remains low will consider Palliative Consult.   Central line replace over a  wire.   Lesli Issa,MD 1:13 PM

## 2013-07-16 NOTE — Progress Notes (Signed)
[facility] PROGRESS NOTE Interim History: 80 y.o. male with past medical history of chronic systolic CHF, chronic ICM, EF 16-10%, CKD stage IV, long standing DM 1 on insulin pump who [facility] 07[facility]/2013 with near syncope. He reported he has frequent near syncopal events and very often feels very unsteady on his feet. Pt was found to have troponin of 0.45, BNP of ~3,000, hemoglobin of 9.2, potassium of 3.6 and creatinine of 2.78. The 12 lead EKG showed no acute ischemic events. Additionally, his weight was up 8 pounds in 1 week period. Cardiology has been following and patient is now being seen by heart failure team. He underwent a right heart catheterization on 3/17 along with placement of triple lumen IJ catheter which revealed severe biventricular heart failure. Given this and his advanced age, options were discussed including comfort care. Patient is interested in trying inotropic support. Started on milrinone on 3/18, which he is not tolerating well. Trying last attempt with a dobutamine in if not will need hospice care  [facility] Weights   07/14/13 0500 07/15/13 0418 07/16/13 0500  Weight: 81.239 kg (179 lb 1.6 oz) 86.6 kg (190 lb 14.7 oz) 87 kg (191 lb 12.8 oz)        Intake/Output Summary (Last 24 hours) at 07/16/13 1810 Last data filed at 07/16/13 1300  Gross per 24 hour  Intake 651.87 ml  Output    425 ml  Net 226.87 ml     Assessment/Plan: Weakness, near syncope:  - Continue midodrine 10 mg PO TID  - Will need PT eval prior to discharge  - Diuretics stopped due to orthostatic hypotension/hypotension  - Syncope (likely) overnight- BP 104/52 and CBG was 209. - EEG: abnormal EEG secondary to posterior background slowing Seen by electrophysiology. Syncope felt to be orthostasis related. Unable to tolerate ACE inhibitor because of renal insufficiency. Given narrow QRS, ICD implantation would not improve his left ventricular function. If pressure would tolerate, low  dose of nitrate hydralazine would be ideal. Not candidate for systemic anticoagulation given prior GI bleed. Hospital day amiodarone low dose may help sinus rhythm. BP will follow as outpatient to make final decision ICD implantation as he is a less than ideal candidate given her advanced age and comorbidities.  Diabetes type 1 with renal manifestations & hypoglycemia  - let pt manage his insulin pump. - Hbg A1c 7.4. Appreciate diabetic education.  Patient does experience some hypoglycemic events at home.  Elevated troponin, CAD (coronary artery disease)  - Troponin trended down to 0.34 --> 0.33  - Appreciate cardiology consult and recommendations- EP consult for ICD if EF remains < 30-35%, IV iron and if Hb < 8 transfuse to atleast 9.  - ECHO: estimated ejection fraction was in the range of 10% to 15%. Dyskinesis of the entireanteroseptal myocardium. Akinesis. Severe hypokinesis of the lateral myocardium.  Acute on Chronic systolic heart failure  - 2 D ECHO in 2015 EF 10%, will need further diuresis. Will consult advance heart failure team. - BNP ~ 3,000 on this admission  - 2 D Echo 07/13/2013: mild LVH, LVEF; 10-15% with Akinesis/hypokinesis of several areas. Milrinone changed to dobutamine - Continue coreg 3.125 mg BID   Paroxysmal atrial fibrillation: Remains in normal sinus rhythm  Acute on CKD (chronic kidney disease) stage 3  - Creatinine stable currently at 2.65 for last 2 days. Elevated from baseline of 1.7 last month - Lasix stopped  - Diuretics DC'ed.  - Not on ACEI/ARB due to  renal failure. Likely due to diuretics/hemodyanamics/cardiorenal syndrome.  - Follow daily basic metabolic panel   Hypothyroidism  - continue levothyroxine   Hypokalemia/Hyperkalemia  - secondary to lasix  - overcorrected. DC K supplements  Dyslipidemia  - continue statin therapy   Anemia of chronic disease/CKD  - Secondary to CKD  - Hemoglobin 8.2  - No indications for transfusion currently.  IV Iron   Code Status: Full code  Family Communication: Wife at the bedside Disposition Plan: Very poor prognosis. May need hospice    Consultants:  Cardiology  neurology  Procedures: ECHO: ef 1-%  Antibiotics:  none (indicate start date, and stop date if known)  HPI/Subjective: Events overnight reviewed. Patient very weak. Tired.  Objective: Filed Vitals:   07/16/13 1056 07/16/13 1100 07/16/13 1630 07/16/13 1718  BP: 118/80 118/80 119/65 119/65  Pulse: 96 98 102 98  Temp: 97 F (36.1 C)   97.6 F (36.4 C)  TempSrc: Oral   Oral  Resp: 20   20  Height:      Weight:      SpO2: 100% 100% 96% 98%     Exam:  General: Alert, awake, oriented x3, in no acute distress.  Heart: Regular rate and rhythm + S3,  Lungs: Crackles at bilateral bases Abdomen: Soft, nontender, nondistended, positive bowel sounds.  Extremities: 3+ edema   Data Reviewed: Basic Metabolic Panel:  Recent Labs Lab 07/13/13 0930 07/14/13 0520 07/14/13 1900 07/15/13 0439 07/16/13 0245  NA 137 139 139 142 137  K 5.3 4.3 4.3 3.9 4.6  CL 89* 93* 93* 94* 90*  CO2 30 30 31  34* 28  GLUCOSE 256* 162* 138* 146* 316*  BUN 78* 76* 69* 66* 67*  CREATININE 3.13* 2.95* 2.64* 2.65* 3.03*  CALCIUM 9.6 9.4 9.4 9.4 9.7  MG  --   --   --   --  1.9   Liver Function Tests: No results found for this basename: AST, ALT, ALKPHOS, BILITOT, PROT, ALBUMIN,  in the last 168 hours No results found for this basename: LIPASE, AMYLASE,  in the last 168 hours No results found for this basename: AMMONIA,  in the last 168 hours CBC:  Recent Labs Lab 07/11/13 1445 07/13/13 0512 07/14/13 0520 07/16/13 0245  WBC 5.2 6.0 5.9 9.1  NEUTROABS 3.5  --   --   --   HGB 9.2* 8.2* 8.1* 8.1*  HCT 29.3* 26.7* 26.3* 26.5*  MCV 86.7 85.3 86.2 86.0  PLT 300 252 255 308   Cardiac Enzymes:  Recent Labs Lab 07/11/13 1445 07/11/13 2055 07/12/13 0520 07/12/13 1019  TROPONINI 0.45* 0.34* 0.36* 0.33*   BNP (last 3  results)  Recent Labs  07/11/13 2055 07/14/13 0520  PROBNP 30352.0* 16662.0*   CBG:  Recent Labs Lab 07/16/13 0134 07/16/13 0607 07/16/13 0823 07/16/13 1102 07/16/13 1721  GLUCAP 264* 285* 324* 237* 124*    Recent Results (from the past 240 hour(s))  MRSA PCR SCREENING     Status: None   Collection Time    07/14/13  6:02 PM      Result Value Ref Range Status   MRSA by PCR NEGATIVE  NEGATIVE Final   Comment:            The GeneXpert MRSA Assay (FDA     approved for NASAL specimens     only), is one component of a     comprehensive MRSA colonization     surveillance program. It is not     intended  to diagnose MRSA     infection nor to guide or     monitor treatment for     MRSA infections.     Studies: Dg Chest Port 1 View  07/16/2013   CLINICAL DATA:  Shortness of breath, central line placement  EXAM: PORTABLE CHEST - 1 VIEW  COMPARISON:  07/12/2013  FINDINGS: Cardiomegaly. Probable bilateral small pleural effusion with bilateral basilar atelectasis or infiltrate. Central mild vascular congestion and mild perihilar interstitial prominence suspicious for mild interstitial edema. There is right IJ central line with tip in distal SVC. No diagnostic pneumothorax.  IMPRESSION: Probable bilateral small pleural effusion with bilateral basilar atelectasis or infiltrate. Central mild vascular congestion and mild perihilar interstitial prominence suspicious for mild interstitial edema. There is right IJ central line with tip in distal SVC. No diagnostic pneumothorax.   Electronically Signed   By: Natasha Mead M.D.   On: 07/16/2013 10:11    Scheduled Meds: . aspirin EC  81 mg Oral QODAY  . heparin  5,000 Units Subcutaneous 3 times per day  . insulin aspart  0-5 Units Subcutaneous QHS  . insulin aspart  0-9 Units Subcutaneous TID WC  . insulin detemir  10 Units Subcutaneous Daily  . levothyroxine  100 mcg Oral QAC breakfast  . lidocaine-EPINEPHrine  20 mL Other Once  . midodrine   5 mg Oral TID WC  . pantoprazole  40 mg Oral BID AC  . simvastatin  10 mg Oral QHS  . sodium chloride  3 mL Intravenous Q12H  . sodium chloride  3 mL Intravenous Q12H   Continuous Infusions: . DOBUTamine 5 mcg/kg/min (07/16/13 1119)     Hollice Espy  [facility] Pager [phone] If 8PM-8AM, please contact night-coverage at [email or web address], password [facility] 07/16/2013, 6:10 PM  LOS: 5 days   25 minutes

## 2013-07-17 DIAGNOSIS — I5022 Chronic systolic (congestive) heart failure: Secondary | ICD-10-CM

## 2013-07-17 DIAGNOSIS — N179 Acute kidney failure, unspecified: Secondary | ICD-10-CM

## 2013-07-17 DIAGNOSIS — I509 Heart failure, unspecified: Secondary | ICD-10-CM

## 2013-07-17 DIAGNOSIS — R55 Syncope and collapse: Secondary | ICD-10-CM

## 2013-07-17 DIAGNOSIS — Z515 Encounter for palliative care: Secondary | ICD-10-CM

## 2013-07-17 DIAGNOSIS — D649 Anemia, unspecified: Secondary | ICD-10-CM

## 2013-07-17 DIAGNOSIS — I5023 Acute on chronic systolic (congestive) heart failure: Secondary | ICD-10-CM

## 2013-07-17 LAB — CARBOXYHEMOGLOBIN
Carboxyhemoglobin: 0.4 % — ABNORMAL LOW (ref 0.5–1.5)
METHEMOGLOBIN: 1.9 % — AB (ref 0.0–1.5)
O2 Saturation: 37.8 %
TOTAL HEMOGLOBIN: 8.1 g/dL — AB (ref 13.5–18.0)

## 2013-07-17 LAB — GLUCOSE, CAPILLARY
GLUCOSE-CAPILLARY: 108 mg/dL — AB (ref 70–99)
GLUCOSE-CAPILLARY: 141 mg/dL — AB (ref 70–99)
GLUCOSE-CAPILLARY: 207 mg/dL — AB (ref 70–99)
GLUCOSE-CAPILLARY: 74 mg/dL (ref 70–99)
Glucose-Capillary: 102 mg/dL — ABNORMAL HIGH (ref 70–99)
Glucose-Capillary: 63 mg/dL — ABNORMAL LOW (ref 70–99)
Glucose-Capillary: 67 mg/dL — ABNORMAL LOW (ref 70–99)
Glucose-Capillary: 78 mg/dL (ref 70–99)
Glucose-Capillary: 86 mg/dL (ref 70–99)
Glucose-Capillary: 133 mg/dL — ABNORMAL HIGH (ref 70–99)
Glucose-Capillary: 109 mg/dL — ABNORMAL HIGH (ref 70–99)

## 2013-07-17 LAB — BASIC METABOLIC PANEL
BUN: 66 mg/dL — ABNORMAL HIGH (ref 6–23)
CHLORIDE: 95 meq/L — AB (ref 96–112)
CO2: 35 mEq/L — ABNORMAL HIGH (ref 19–32)
Calcium: 9.8 mg/dL (ref 8.4–10.5)
Creatinine, Ser: 2.78 mg/dL — ABNORMAL HIGH (ref 0.50–1.35)
GFR calc non Af Amer: 20 mL/min — ABNORMAL LOW (ref >90)
GFR, EST AFRICAN AMERICAN: 23 mL/min — AB (ref >90)
Glucose, Bld: 61 mg/dL — ABNORMAL LOW (ref 70–99)
Potassium: 3.7 mEq/L (ref 3.7–5.3)
SODIUM: 142 meq/L (ref 137–147)

## 2013-07-17 MED ORDER — SENNOSIDES-DOCUSATE SODIUM 8.6-50 MG PO TABS: 2.0000 | ORAL_TABLET | Freq: Every evening | ORAL | Status: DC | PRN

## 2013-07-17 MED ORDER — INSULIN PEN STARTER KIT
1.0000 | Freq: Once | Status: AC
  Administered 2013-07-17: 1
  Filled 2013-07-17: qty 1

## 2013-07-17 MED ORDER — MORPHINE SULFATE (CONCENTRATE) 10 MG /0.5 ML PO SOLN: 5.0000 mg | ORAL | Status: DC | PRN

## 2013-07-17 MED ORDER — GLUCOSE-VITAMIN C 4-6 GM-MG PO CHEW
CHEWABLE_TABLET | ORAL | Status: AC
  Filled 2013-07-17: qty 1

## 2013-07-17 MED ORDER — ALPRAZOLAM 0.5 MG PO TABS
0.5000 mg | ORAL_TABLET | Freq: Three times a day (TID) | ORAL | Status: DC | PRN
  Administered 2013-07-17: 0.5 mg via ORAL
  Filled 2013-07-17: qty 1

## 2013-07-17 MED ORDER — INSULIN DETEMIR 100 UNIT/ML ~~LOC~~ SOLN
8.0000 [IU] | Freq: Every day | SUBCUTANEOUS | Status: DC
  Administered 2013-07-17: 8 [IU] via SUBCUTANEOUS
  Administered 2013-07-18: 4 [IU] via SUBCUTANEOUS
  Filled 2013-07-17 (×2): qty 0.08

## 2013-07-17 NOTE — Progress Notes (Addendum)
Subjective:  Switched from milrinone to dobutamine overnight. Continue to decline. Co-ox in 30s. Less confused.    Objective: Vital signs in last 24 hours: Temp:  [97 F (36.1 C)-98.7 F (37.1 C)] 98.2 F (36.8 C) (03/20 0400) Pulse Rate:  [93-104] 100 (03/20 0400) Resp:  [20-24] 24 (03/19 2000) BP: (86-119)/(51-80) 96/62 mmHg (03/20 0400) SpO2:  [96 %-100 %] 100 % (03/20 0400) Last BM Date: 07/13/13  Intake/Output from previous day: 03/19 0701 - 03/20 0700 In: 1300.3 [P.O.:930; I.V.:370.3] Out: 475 [Urine:475] Intake/Output this shift:    Medications Current Facility-Administered Medications  Medication Dose Route Frequency Provider Last Rate Last Dose  . 0.9 %  sodium chloride infusion  250 mL Intravenous PRN Dolores Pattyaniel R Bensimhon, MD 10 mL/hr at 07/16/13 1120    . acetaminophen (TYLENOL) tablet 650 mg  650 mg Oral Q6H PRN Christiane Haorinna L Sullivan, MD   650 mg at 07/11/13 2249   Or  . acetaminophen (TYLENOL) suppository 650 mg  650 mg Rectal Q6H PRN Christiane Haorinna L Sullivan, MD      . alum & mag hydroxide-simeth (MAALOX/MYLANTA) 200-200-20 MG/5ML suspension 30 mL  30 mL Oral Q4H PRN Ardis RowanMatthew Whitlock, MD   30 mL at 07/15/13 2231  . aspirin EC tablet 81 mg  81 mg Oral QODAY Si Raiderhristopher A. End, MD   81 mg at 07/15/13 0923  . DOBUTamine (DOBUTREX) infusion 4000 mcg/mL  5 mcg/kg/min Intravenous Titrated Dolores Pattyaniel R Bensimhon, MD 6.5 mL/hr at 07/16/13 1119 5 mcg/kg/min at 07/16/13 1119  . glucose-Vitamin C 4-0.006 GM per chewable tablet           . heparin injection 5,000 Units  5,000 Units Subcutaneous 3 times per day Dolores Pattyaniel R Bensimhon, MD   5,000 Units at 07/17/13 0600  . insulin aspart (novoLOG) injection 0-5 Units  0-5 Units Subcutaneous QHS Hollice EspySendil K Krishnan, MD      . insulin aspart (novoLOG) injection 0-9 Units  0-9 Units Subcutaneous TID WC Hollice EspySendil K Krishnan, MD   3 Units at 07/16/13 1344  . insulin detemir (LEVEMIR) injection 10 Units  10 Units Subcutaneous Daily Hollice EspySendil K Krishnan, MD    10 Units at 07/16/13 1237  . levothyroxine (SYNTHROID, LEVOTHROID) tablet 100 mcg  100 mcg Oral QAC breakfast Christiane Haorinna L Sullivan, MD   100 mcg at 07/16/13 0800  . lidocaine-EPINEPHrine (XYLOCAINE W/EPI) 1 %-1:100000 (with pres) injection 20 mL  20 mL Other Once Dolores Pattyaniel R Bensimhon, MD      . midodrine (PROAMATINE) tablet 5 mg  5 mg Oral TID WC Hollice EspySendil K Krishnan, MD   5 mg at 07/16/13 1731  . ondansetron (ZOFRAN) tablet 4 mg  4 mg Oral Q6H PRN Christiane Haorinna L Sullivan, MD   4 mg at 07/13/13 1142   Or  . ondansetron (ZOFRAN) injection 4 mg  4 mg Intravenous Q6H PRN Christiane Haorinna L Sullivan, MD   4 mg at 07/15/13 1732  . pantoprazole (PROTONIX) EC tablet 40 mg  40 mg Oral BID AC Christiane Haorinna L Sullivan, MD   40 mg at 07/16/13 1731  . senna-docusate (Senokot-S) tablet 1 tablet  1 tablet Oral QHS PRN Christiane Haorinna L Sullivan, MD      . simvastatin (ZOCOR) tablet 10 mg  10 mg Oral QHS Christiane Haorinna L Sullivan, MD   10 mg at 07/16/13 2245  . sodium chloride 0.9 % injection 3 mL  3 mL Intravenous Q12H Christiane Haorinna L Sullivan, MD   3 mL at 07/16/13 2245  . sodium chloride 0.9 % injection  3 mL  3 mL Intravenous Q12H Dolores Patty, MD   3 mL at 07/16/13 1120  . sodium chloride 0.9 % injection 3 mL  3 mL Intravenous PRN Dolores Patty, MD        PE: CVP 15 General:  Elderly. Chronically ill-appearing No resp difficulty HEENT: normal Neck: supple. RIJ TLC. Carotids 2+ bilat; no bruits. No lymphadenopathy or thryomegaly appreciated. Cor: PMI nondisplaced. Regular rate & rhythm. +s3 Lungs: clear Abdomen: soft, nontender, nondistended. No hepatosplenomegaly. No bruits or masses. Good bowel sounds. Extremities: no cyanosis, clubbing, rash, 1+ edema Neuro: alert, cranial nerves grossly intact. moves all 4 extremities w/o difficulty. Affect pleasant   Lab Results:   Recent Labs  07/16/13 0245  WBC 9.1  HGB 8.1*  HCT 26.5*  PLT 308   BMET  Recent Labs  07/15/13 0439 07/16/13 0245 07/17/13 0500  NA 142 137 142  K 3.9  4.6 3.7  CL 94* 90* 95*  CO2 34* 28 35*  GLUCOSE 146* 316* 61*  BUN 66* 67* 66*  CREATININE 2.65* 3.03* 2.78*  CALCIUM 9.4 9.7 9.8   Cardiac Panel (last 3 results) No results found for this basename: CKTOTAL, CKMB, TROPONINI, RELINDX,  in the last 72 hours  Assessment:  1. A/c systolic HF/cardiogenic shock 2. iCM EF 10% 3. DM1 (x . 60 years) with hypoglycemia  4. Orthostatic hypotension 5. Acute on CKD, stage IV  6.  Limited code. No CPR. Defib, intubation and pressors OK.  Plan/Discussion:  He has not responded to inotropic support. I think we are in a terminal situation. I discussed the options with him and his family. Currently he would like [facility] with transition to the [facility]. Will make him comfort care and consult Palliative Care team. Stop all nonessential meds. If deteriorates further may need inpatient Hospice with morphine gtt.   Daniel Bensimhon,MD 7:01 AM

## 2013-07-17 NOTE — Progress Notes (Addendum)
Inpatient Diabetes Program Recommendations  AACE/ADA: New Consensus Statement on Inpatient Glycemic Control (2013)  Target Ranges:  Prepandial:   less than 140 mg/dL      Peak postprandial:   less than 180 mg/dL (1-2 hours)      Critically ill patients:  140 - 180 mg/dL  Results for [name], [name] (MRN [id number]) as of 07/17/2013 07:52  Ref. Range 07/17/2013 00:44 07/17/2013 02:57 07/17/2013 04:52 07/17/2013 05:27 07/17/2013 05:53  Glucose-Capillary Latest Range: 70-99 mg/dL 108 (H) 207 (H) 63 (L) 67 (L) 78    Inpatient Diabetes Program Recommendations Insulin - Basal: Decrease Levemir to 8 units  Pt with HYPOglycemia this morning.  ADDENDUM: this coordinator spoke with Dr. Maryland Pink and recommended decreasing Levemir dose instead of discontinuing completely since patient has Type-1 DM.  Telephone order given to restart Levemir at 8 units daily and discontinue HS Novolog scale. ADDENDUM: spoke with Mrs. Olund and daughter concerning insulin admin at home. Recommended using Levemir pen as daughter is familiar with pen admin since she is a pharm rep for a DM med that uses a pen device.  Insulin pen starter kit ordered and insulin pen video on pt ed network. Left contact info for Diabetes Coordinator with pts daughter for questions/concerns.  Thank you  Raoul Pitch BSN, RN,CDE Inpatient Diabetes Coordinator [phone] (team pager)

## 2013-07-17 NOTE — Care Management Note (Signed)
    Page 1 of 2   07/17/2013     12:50:28 PM   CARE MANAGEMENT NOTE 07/17/2013  Patient:  [facility]   Account Number:  [id number]  Date Initiated:  07/15/2013  Documentation initiated by:  Britton Perkinson  Subjective/Objective Assessment:   dx BiV HF; lives with spouse, has walker and transport w/c    PCP  Dr Einar Crow     DC Planning Services  CM consult      [facility] Choice  HOSPICE   Choice offered to / List presented to:  C-3 Spouse   DME arranged  3-N-1  HOSPITAL BED  OXYGEN  OVERBED TABLE      DME agency  [facility]    HH arranged  HH-1 RN  HH-6 SOCIAL WORKER      HH agency  [facility]   Per UR Regulation:  Reviewed for med. necessity/level of care/duration of stay  If discussed at Long Length of Stay Meetings, dates discussed:   07/16/2013   Comments:  ContactCira Rue, spouse  [phone], [phone]  07/17/12 1050 Rotunda Worden RN MSN BSN CCM Pt to d/c home with hospice services.  Provided list to spouse and dtr, referral made per choice, documentation faxed as requested.  List of private duty agencies also provided to spouse per her request.  Plan is to d/c home on 2/21.  07/16/13 5597 Verdis Prime RN MSN BSN CCM Family present, milrinone changed to dopamine, HF team will notify CM if pt to d/c home on drip.  PT recommends home therapy - will arrange when condition stabilizes.  07/15/13 0950 Ona Rathert RN MSN BSN CCM S/P cath, severev biV failure;  plan is d/c home on milrinone vs hospice care. 1400 Pt has cane and walker if needed, awaiting PT eval.

## 2013-07-17 NOTE — Progress Notes (Signed)
[facility] PROGRESS NOTE Interim History: 80 y.o. male with past medical history of chronic systolic CHF, chronic ICM, EF 86-57%, CKD stage IV, long standing DM 1 on insulin pump who presented to [facility] ED 07/11/2013 with near syncope. He reported he has frequent near syncopal events and very often feels very unsteady on his feet. Pt was found to have troponin of 0.45, BNP of ~3,000, hemoglobin of 9.2, potassium of 3.6 and creatinine of 2.78. The 12 lead EKG showed no acute ischemic events. Additionally, his weight was up 8 pounds in 1 week period. Cardiology has been following and patient is now being seen by heart failure team. He underwent a right heart catheterization on 3/17 along with placement of triple lumen IJ catheter which revealed severe biventricular heart failure. Given this and his advanced age, options were discussed including comfort care. Patient is interested in trying inotropic support. Started on milrinone on 3/18, which was changed to dobutamine yesterday. Unfortunately, patient continues to decline. Now planning for full comfort care.  Filed Weights   07/14/13 0500 07/15/13 0418 07/16/13 0500  Weight: 81.239 kg (179 lb 1.6 oz) 86.6 kg (190 lb 14.7 oz) 87 kg (191 lb 12.8 oz)        Intake/Output Summary (Last 24 hours) at 07/17/13 1054 Last data filed at 07/17/13 0804  Gross per 24 hour  Intake 1055.02 ml  Output    475 ml  Net 580.02 ml     Assessment/Plan: Weakness, near syncope:  - Midodrine discontinued - Will need PT eval prior to discharge  - Diuretics stopped due to orthostatic hypotension/hypotension  - Syncope (likely) overnight- BP 104/52 and CBG was 209. - EEG: abnormal EEG secondary to posterior background slowing Seen by electrophysiology. Syncope felt to be orthostasis related. Unable to tolerate ACE inhibitor because of renal insufficiency. Given narrow QRS, ICD implantation would not improve his left ventricular function. If pressure would  tolerate, low dose of nitrate hydralazine would be ideal. Not candidate for systemic anticoagulation given prior GI bleed. Hospital day amiodarone low dose may help sinus rhythm. Was consideration of ICD implantation, now Comfort Care is been initiated  Diabetes type 1 with renal manifestations & hypoglycemia  - Insulin pump stopped - Hbg A1c 7.4. Appreciate diabetic education.  Episodes of hypoglycemia persisting. Spoke with [facility] manager. Decreasing Levemir, decreasing sliding-scale no at bedtime coverage.  Elevated troponin, CAD (coronary artery disease)  - Troponin trended down to 0.34 --> 0.33  - Appreciate cardiology consult and recommendations- EP consult for ICD if EF remains < 30-35%, IV iron and if Hb < 8 transfuse to atleast 9.  - ECHO: estimated ejection fraction was in the range of 10% to 15%. Dyskinesis of the entireanteroseptal myocardium. Akinesis. Severe hypokinesis of the lateral myocardium.  Acute on Chronic systolic heart failure  - 2 D ECHO in 2015 EF 10%, will need further diuresis. Will consult advance heart failure team. - BNP ~ 3,000 on this admission  - 2 D Echo 07/13/2013: mild LVH, LVEF; 10-15% with Akinesis/hypokinesis of several areas. Milrinone changed to dobutamine - Continue coreg 3.125 mg BID  Medications not stopped it Comfort Care  Paroxysmal atrial fibrillation: Remains in normal sinus rhythm  Acute on CKD (chronic kidney disease) stage 3  - Creatinine stable currently at 2.65 for last 2 days. Elevated from baseline of 1.7 last month - Lasix stopped  - Diuretics DC'ed.  - Not on ACEI/ARB due to renal failure. Likely due to diuretics/hemodyanamics/cardiorenal syndrome.  - Follow  daily basic metabolic panel   Hypothyroidism  - continue levothyroxine   Hypokalemia/Hyperkalemia  - secondary to lasix  - overcorrected. DC K supplements  Dyslipidemia  -  Statin stopped it Comfort Care  Anemia of chronic disease/CKD  - Secondary to CKD  -  Hemoglobin 8.2  - No indications for transfusion currently. IV Iron   Code Status: Comfort Care Family Communication: Wife at the bedside Disposition Plan: Plan is for comfort care. Looking at hospice options    Consultants:  Cardiology  neurology  Palliative medicine  Procedures: ECHO: ef 1-%  Antibiotics:  none (indicate start date, and stop date if known)  HPI/Subjective: Patient feeling okay. Very tired. Some short of breath  Objective: Filed Vitals:   07/17/13 0738 07/17/13 0804 07/17/13 0900 07/17/13 1000  BP: 69/35 95/54 60/26  105/69  Pulse:   78 101  Temp:      TempSrc:      Resp:  19    Height:      Weight:      SpO2:  98% 100% 100%     Exam:  General: Alert, awake, oriented x3, in no acute distress.  Heart: Regular rate and rhythm + S3,  Lungs: Crackles at bilateral bases Abdomen: Soft, nontender, nondistended, positive bowel sounds.  Extremities: 3+ edema   Data Reviewed: Basic Metabolic Panel:  Recent Labs Lab 07/14/13 0520 07/14/13 1900 07/15/13 0439 07/16/13 0245 07/17/13 0500  NA 139 139 142 137 142  K 4.3 4.3 3.9 4.6 3.7  CL 93* 93* 94* 90* 95*  CO2 30 31 34* 28 35*  GLUCOSE 162* 138* 146* 316* 61*  BUN 76* 69* 66* 67* 66*  CREATININE 2.95* 2.64* 2.65* 3.03* 2.78*  CALCIUM 9.4 9.4 9.4 9.7 9.8  MG  --   --   --  1.9  --    Liver Function Tests: No results found for this basename: AST, ALT, ALKPHOS, BILITOT, PROT, ALBUMIN,  in the last 168 hours No results found for this basename: LIPASE, AMYLASE,  in the last 168 hours No results found for this basename: AMMONIA,  in the last 168 hours CBC:  Recent Labs Lab 07/11/13 1445 07/13/13 0512 07/14/13 0520 07/16/13 0245  WBC 5.2 6.0 5.9 9.1  NEUTROABS 3.5  --   --   --   HGB 9.2* 8.2* 8.1* 8.1*  HCT 29.3* 26.7* 26.3* 26.5*  MCV 86.7 85.3 86.2 86.0  PLT 300 252 255 308   Cardiac Enzymes:  Recent Labs Lab 07/11/13 1445 07/11/13 2055 07/12/13 0520 07/12/13 1019    TROPONINI 0.45* 0.34* 0.36* 0.33*   BNP (last 3 results)  Recent Labs  07/11/13 2055 07/14/13 0520  PROBNP 30352.0* 16662.0*   CBG:  Recent Labs Lab 07/17/13 0257 07/17/13 0452 07/17/13 0527 07/17/13 0553 07/17/13 0736  GLUCAP 207* 63* 67* 78 86    Recent Results (from the past 240 hour(s))  MRSA PCR SCREENING     Status: None   Collection Time    07/14/13  6:02 PM      Result Value Ref Range Status   MRSA by PCR NEGATIVE  NEGATIVE Final   Comment:            The GeneXpert MRSA Assay (FDA     approved for NASAL specimens     only), is one component of a     comprehensive MRSA colonization     surveillance program. It is not     intended to diagnose MRSA  infection nor to guide or     monitor treatment for     MRSA infections.     Studies: Dg Chest Port 1 View  07/16/2013   CLINICAL DATA:  Shortness of breath, central line placement  EXAM: PORTABLE CHEST - 1 VIEW  COMPARISON:  07/12/2013  FINDINGS: Cardiomegaly. Probable bilateral small pleural effusion with bilateral basilar atelectasis or infiltrate. Central mild vascular congestion and mild perihilar interstitial prominence suspicious for mild interstitial edema. There is right IJ central line with tip in distal SVC. No diagnostic pneumothorax.  IMPRESSION: Probable bilateral small pleural effusion with bilateral basilar atelectasis or infiltrate. Central mild vascular congestion and mild perihilar interstitial prominence suspicious for mild interstitial edema. There is right IJ central line with tip in distal SVC. No diagnostic pneumothorax.   Electronically Signed   By: Natasha Mead M.D.   On: 07/16/2013 10:11    Scheduled Meds: . glucose-Vitamin C      . insulin aspart  0-9 Units Subcutaneous TID WC  . insulin detemir  8 Units Subcutaneous Daily  . levothyroxine  100 mcg Oral QAC breakfast  . lidocaine-EPINEPHrine  20 mL Other Once  . sodium chloride  3 mL Intravenous Q12H  . sodium chloride  3 mL  Intravenous Q12H   Continuous Infusions:     Hollice Espy  [facility] Pager [phone] If 8PM-8AM, please contact night-coverage at [email or web address], password [facility] 07/17/2013, 10:54 AM  LOS: 6 days   25 minutes

## 2013-07-17 NOTE — Progress Notes (Signed)
PT Cancellation Note  Patient Details Name: Joseph Burgess MRN: [id number] DOB: 05/08/1929   Cancelled Treatment:    Reason Eval/Treat Not Completed: Other (comment) (pt with potential transition to morphine drip and comfort awaiting palliative consult and hold per RN)   Toney Sang Beth 07/17/2013, 7:40 AM Delaney Meigs, PT [phone]

## 2013-07-17 NOTE — Consult Note (Signed)
Patient [name]      DOB: 08/12/1929      OIB:[id number]     Consult Note from the Palliative Medicine Team at [place] Requested by: Dr. Haroldine Laws   PCP: Joseph Ruths., MD Reason for Consultation: Hospice care consult.  Phone Number:[phone]  Assessment of patients Current state: Joseph Burgess is a 80 yo male systolic CHF (EF 94-50%), chronic ICM, CKD stage IV, DM 1 with insulin pump. His wife described numerous syncopal/near syncopal episodes recently at home which prompted his admission as wife was concerned about seizures or any other treatable causes for these episodes. She tells me that she did believe it was his BP but wanted to be certain there was not anything else going on they could manage better. Since being hospitalized Joseph Burgess has been initiated and did not tolerate milrinone or dobutamine and continues to have low BP (SBP 60s at times) and co-ox 30-40s.   I met today with Joseph Burgess, his wife Joseph Burgess, son Joseph Burgess and his wife Joseph Burgess. Other daughter, Joseph Burgess, is on her way this morning but family did not wish to wait to meet. Joseph Burgess is sitting up in the chair and looks better than I expected but is exhausted as he had just had a bath. Arlene tells me his history and how he was admitted to the hospital and they are understanding of his poor prognosis and the limitations in treatment for him. He is requesting to go home as soon as possible and would like to die at home - his family is supportive of this decision. We discussed hospice care and what they provided and discussed the need for personal caregivers to assist for the time hospice is not there (wife says that she needs help 24/7). Case management was asked to provide choice for hospice and to provide contact list for private duty care. DNR is confirmed and they are now wanting full comfort care with hospice.    Goals of Care: 1.  Code Status: DNR   2. Scope of Treatment: Continue close monitor in  ICU but allow for sleep tonight. Plan is home with full comfort measures and hospice.    4. Disposition: Home with hospice tomorrow.    3. Symptom Management:   1. Anxiety/Agitation: Xanax prn.  2. Pain: Roxanol prn.  3. Bowel Regimen: Senna S prn.  4. Fever: Acetaminophen prn.  5. Nausea/Vomiting: Ondansetron prn.   4. Psychosocial: Emotional support provided to patient and family during sensitive conversation.   5. Spiritual: Support by personal pastor.    Patient Documents Completed or Given: Document Given Completed  Advanced Directives Pkt    MOST    DNR  yes  Gone from [facility]    Hard Choices      Brief HPI: 80 yo male systolic CHF (EF 38-88%), chronic ICM, CKD stage IV, DM 1 with insulin pump. His wife described numerous syncopal/near syncopal episodes recently at home which prompted his admission as wife was concerned about seizures or any other treatable causes for these episodes. She tells me that she did believe it was his BP but wanted to be certain there was not anything else going on they could manage better. Since being hospitalized Joseph Burgess has been initiated and did not tolerate milrinone or dobutamine and continues to have low BP (SBP 60s at times) and co-ox 30-40s.     ROS: Denies pain, nausea, constipation. + sleep disturbance, + intermittent dyspnea/SOB    PMH:  Past Medical History  Diagnosis Date  . Hypertension   . Diabetes mellitus without complication   . Thyroid disease   . History of GI bleed   . Diabetic retinopathy   . Diabetic peripheral neuropathy   . A-fib 2014  . Anemia   . Iron deficiency   . Pneumonia and influenza 2011  . Tetanus   . MI (myocardial infarction) 2014     PSH: Past Surgical History  Procedure Laterality Date  . Back surgery    . Vitrectomy      bilateral  . Foot surgery      bilateral  . Fetal blood transfusion      GI  . Colonoscopy     I have reviewed the FH and SH and  If appropriate update it with  new information. Allergies  Allergen Reactions  . Ambien [Zolpidem Tartrate] Other (See Comments)    Altered mental status, agitation   Scheduled Meds: . glucose-Vitamin C      . insulin aspart  0-9 Units Subcutaneous TID WC  . insulin detemir  8 Units Subcutaneous Daily  . levothyroxine  100 mcg Oral QAC breakfast  . lidocaine-EPINEPHrine  20 mL Other Once  . sodium chloride  3 mL Intravenous Q12H  . sodium chloride  3 mL Intravenous Q12H   Continuous Infusions:  PRN Meds:.sodium chloride, acetaminophen, acetaminophen, alum & mag hydroxide-simeth, morphine CONCENTRATE, ondansetron (ZOFRAN) IV, ondansetron, senna-docusate, sodium chloride    BP 105/69  Pulse 101  Temp(Src) 98.4 F (36.9 C) (Oral)  Resp 19  Ht 5' 8"  (1.727 m)  Wt 87 kg (191 lb 12.8 oz)  BMI 29.17 kg/m2  SpO2 100%   PPS: 30%   Intake/Output Summary (Last 24 hours) at 07/17/13 1050 Last data filed at 07/17/13 0804  Gross per 24 hour  Intake 1055.02 ml  Output    475 ml  Net 580.02 ml   LBM: 07/13/13                         Physical Exam:  General: NAD, awake, alert HEENT: [place]/AT, +JVP, dry mucous membranes Chest: Bibasilar crackles, no labored breathing, symmetric CVS: RRR, S1 S2 Abdomen: Soft, NT, ND, +BS Ext: MAE, warm to touch, BLE 1+ edema Neuro: Alert, oriented person/place/situation, follows commands  Labs: CBC    Component Value Date/Time   WBC 9.1 07/16/2013 0245   WBC 3.7 04/01/2013 1017   RBC 3.08* 07/16/2013 0245   RBC 2.45* 04/01/2013 1017   HGB 8.1* 07/16/2013 0245   HCT 26.5* 07/16/2013 0245   PLT 308 07/16/2013 0245   MCV 86.0 07/16/2013 0245   MCH 26.3 07/16/2013 0245   MCH 33.1* 04/01/2013 1017   MCHC 30.6 07/16/2013 0245   MCHC 33.3 04/01/2013 1017   RDW 15.4 07/16/2013 0245   RDW 14.4 04/01/2013 1017   LYMPHSABS 1.1 07/11/2013 1445   LYMPHSABS 1.1 04/01/2013 1017   MONOABS 0.4 07/11/2013 1445   EOSABS 0.1 07/11/2013 1445   EOSABS 0.1 04/01/2013 1017   BASOSABS 0.0 07/11/2013 1445    BASOSABS 0.0 04/01/2013 1017    BMET    Component Value Date/Time   NA 142 07/17/2013 0500   K 3.7 07/17/2013 0500   CL 95* 07/17/2013 0500   CO2 35* 07/17/2013 0500   GLUCOSE 61* 07/17/2013 0500   BUN 66* 07/17/2013 0500   CREATININE 2.78* 07/17/2013 0500   CALCIUM 9.8 07/17/2013 0500   GFRNONAA 20* 07/17/2013 0500  GFRAA 23* 07/17/2013 0500    CMP     Component Value Date/Time   NA 142 07/17/2013 0500   K 3.7 07/17/2013 0500   CL 95* 07/17/2013 0500   CO2 35* 07/17/2013 0500   GLUCOSE 61* 07/17/2013 0500   BUN 66* 07/17/2013 0500   CREATININE 2.78* 07/17/2013 0500   CALCIUM 9.8 07/17/2013 0500   GFRNONAA 20* 07/17/2013 0500   GFRAA 23* 07/17/2013 0500    Time In Time Out Total Time Spent with Patient Total Overall Time  0930 1045 20mn 780m    Greater than 50%  of this time was spent counseling and coordinating care related to the above assessment and plan.  AlVinie SillNP Palliative Medicine Team Pager # [phone]-F 8a-5p) Team Phone # [phone]/Weekends)

## 2013-07-18 DIAGNOSIS — E1129 Type 2 diabetes mellitus with other diabetic kidney complication: Secondary | ICD-10-CM

## 2013-07-18 DIAGNOSIS — N058 Unspecified nephritic syndrome with other morphologic changes: Secondary | ICD-10-CM

## 2013-07-18 DIAGNOSIS — Z794 Long term (current) use of insulin: Secondary | ICD-10-CM

## 2013-07-18 LAB — GLUCOSE, CAPILLARY
GLUCOSE-CAPILLARY: 36 mg/dL — AB (ref 70–99)
GLUCOSE-CAPILLARY: 48 mg/dL — AB (ref 70–99)
Glucose-Capillary: 61 mg/dL — ABNORMAL LOW (ref 70–99)
Glucose-Capillary: 97 mg/dL (ref 70–99)

## 2013-07-18 MED ORDER — ACETAMINOPHEN 325 MG PO TABS: 650.0000 mg | ORAL_TABLET | Freq: Four times a day (QID) | ORAL | Status: AC | PRN

## 2013-07-18 MED ORDER — MORPHINE SULFATE (CONCENTRATE) 10 MG /0.5 ML PO SOLN: 5.0000 mg | ORAL | Status: AC | PRN

## 2013-07-18 MED ORDER — ONDANSETRON HCL 4 MG PO TABS: 4.0000 mg | ORAL_TABLET | Freq: Four times a day (QID) | ORAL | Status: AC | PRN

## 2013-07-18 MED ORDER — INSULIN DETEMIR 100 UNIT/ML FLEXPEN: 4.0000 [IU] | PEN_INJECTOR | Freq: Every day | SUBCUTANEOUS | Status: AC

## 2013-07-18 MED ORDER — SENNOSIDES-DOCUSATE SODIUM 8.6-50 MG PO TABS: 2.0000 | ORAL_TABLET | Freq: Every evening | ORAL | Status: AC | PRN

## 2013-07-18 MED ORDER — ALPRAZOLAM 1 MG/ML PO CONC: 0.5000 mg | Freq: Three times a day (TID) | ORAL | Status: AC | PRN

## 2013-07-18 NOTE — Discharge Summary (Signed)
Physician Discharge Summary  Bal HarbourBilly Burgess UJW:[id number] DOB: 08-Jan-1930 DOA: 07/11/2013  PCP: Joseph RegulusANDERSON,MARSHALL W., MD  Admit date: 07/11/2013 Discharge date: 07/18/2013  Time spent: 25 minutes  Recommendations for Outpatient Follow-up:  1. Patient is being discharged to home with hospice for comfort care; medication regimen being greatly streamlined  Discharge Diagnoses:  Principal Problem:   Near syncope Active Problems:   Diabetes type 1, controlled   Ischemic cardiomyopathy   Chronic systolic heart failure   Hypothyroidism   Insulin dependent diabetes mellitus with renal manifestation   Diabetic retinopathy   Elevated troponin   Hypokalemia   Unspecified hypothyroidism   Dyslipidemia   Renal failure, acute on chronic   Hyperkalemia   Systolic CHF, acute on chronic   Anemia   Cardiogenic shock   Discharge Condition: Patient be made comfort care, discharged home  Diet recommendation: Heart healthy  Filed Weights   07/14/13 0500 07/15/13 0418 07/16/13 0500  Weight: 81.239 kg (179 lb 1.6 oz) 86.6 kg (190 lb 14.7 oz) 87 kg (191 lb 12.8 oz)    History of present illness:  80 year old male who presented on 3/14 after having multiple near syncopal episodes over the past few days. Patient has a history of systolic CHF, orthostatic hypotension diabetes and chronic kidney disease. The emergency room he was noted to have a creatinine of 2.8 with a baseline 1.7, troponin is 0.45 and was admitted to the hospitalist service. Patient was noted to have a weight gain of 8 pounds in the past week as he had stopped taking his evening Lasix a few days ago on his own. Cardiology was consulted.  Hospital Course:  Patient evaluated by cardiology, telemetry noted no evidence of arrhythmias. Neurology was consulted for EEG and no evidence of seizure activity was seen. Given low EF, midodrine was not recommended. Patient underwent right heart catheterization which revealed severe biventricular  heart failure with ejection fraction of only 10-15%. Over the next few days, the patient continued to deteriorate with worsening cardiogenic shock. Milrinone drip but this provided little relief. This was changed over to a dobutamine drip, but unfortunately this did not give any benefit. In discussion with patient's wife and the patient, the patient felt that he was accepting of his diagnosis and terminal prognosis.  He was changed to a DO NOT RESUSCITATE. Following lack of improvement with dobutamine, and agreed upon by family and patient was made comfort care. Palliative medicine was able to coordinate and home hospice was set up. The patient's medications were streamlined to medications for treatment of pain, nausea, constipation, agitation, and his insulin. Patient was continued on Levemir, but a much lower dose of 4 units daily.  Procedures:  Echocardiogram done 3/16: Severely reduced ejection fraction of 10-15% with moderately increased pulmonary artery pressure of 53  Consultations:  Radiology  Neurology  Palliative medicine  Discharge Exam: Filed Vitals:   07/18/13 0757  BP: 120/82  Pulse: 88  Temp: 97.7 F (36.5 C)  Resp:     General: Alert and oriented x3, fatigue, no acute distress Cardiovascular: Regular rate and rhythm, S1-S2, 2/6 systolic ejection murmur Respiratory: Moderate to effort, decreased at the bases  Discharge Instructions  Discharge Orders   Future Orders Complete By Expires   Bed rest  As directed    Diet - low sodium heart healthy  As directed        Medication List    STOP taking these medications       carvedilol 3.125 MG tablet  Commonly  known as:  COREG     levothyroxine 100 MCG tablet  Commonly known as:  SYNTHROID, LEVOTHROID     lovastatin 20 MG tablet  Commonly known as:  MEVACOR     midodrine 5 MG tablet  Commonly known as:  PROAMATINE     OMNIPOD Misc     pantoprazole 40 MG tablet  Commonly known as:  PROTONIX      potassium chloride 10 MEQ tablet  Commonly known as:  K-DUR,KLOR-CON     torsemide 20 MG tablet  Commonly known as:  DEMADEX      TAKE these medications       acetaminophen 325 MG tablet  Commonly known as:  TYLENOL  Take 2 tablets (650 mg total) by mouth every 6 (six) hours as needed for mild pain (or Fever >/= 101).     Alprazolam 1 MG/ML Conc  Commonly known as:  ALPRAZOLAM INTENSOL  Take 0.5 mLs (0.5 mg total) by mouth 3 (three) times daily as needed.     Insulin Detemir 100 UNIT/ML Pen  Commonly known as:  LEVEMIR FLEXPEN  Inject 4 Units into the skin daily at 10 pm.     morphine CONCENTRATE 10 mg / 0.5 ml concentrated solution  Take 0.25 mLs (5 mg total) by mouth every 2 (two) hours as needed for moderate pain, severe pain or shortness of breath.     ondansetron 4 MG tablet  Commonly known as:  ZOFRAN  Take 1 tablet (4 mg total) by mouth every 6 (six) hours as needed for nausea.     senna-docusate 8.6-50 MG per tablet  Commonly known as:  Senokot-S  Take 2 tablets by mouth at bedtime as needed for mild constipation.       Allergies  Allergen Reactions  . Ambien [Zolpidem Tartrate] Other (See Comments)    Altered mental status, agitation      The results of significant diagnostics from this hospitalization (including imaging, microbiology, ancillary and laboratory) are listed below for reference.    Significant Diagnostic Studies: Dg Chest 2 View  07/12/2013   CLINICAL DATA:  Chest pain and shortness of breath  EXAM: CHEST  2 VIEW  COMPARISON:  06/16/2012  FINDINGS: Cardiac shadow is stable but mildly enlarged. Patchy infiltrative changes are noted in the mid lungs bilaterally. Portion of this may be chronic in nature as similar infiltrates are noted on the previous exam. No sizable effusion is seen. The bony structures are within normal limits.  IMPRESSION: Bilateral patchy infiltrates. A portion of this may be chronic in nature. Followup films are recommended.    Electronically Signed   By: Alcide Clever M.D.   On: 07/12/2013 09:41   Dg Chest Port 1 View  07/16/2013   CLINICAL DATA:  Shortness of breath, central line placement  EXAM: PORTABLE CHEST - 1 VIEW  COMPARISON:  07/12/2013  FINDINGS: Cardiomegaly. Probable bilateral small pleural effusion with bilateral basilar atelectasis or infiltrate. Central mild vascular congestion and mild perihilar interstitial prominence suspicious for mild interstitial edema. There is right IJ central line with tip in distal SVC. No diagnostic pneumothorax.  IMPRESSION: Probable bilateral small pleural effusion with bilateral basilar atelectasis or infiltrate. Central mild vascular congestion and mild perihilar interstitial prominence suspicious for mild interstitial edema. There is right IJ central line with tip in distal SVC. No diagnostic pneumothorax.   Electronically Signed   By: Natasha Mead M.D.   On: 07/16/2013 10:11    Microbiology: Recent Results (from the past  240 hour(s))  MRSA PCR SCREENING     Status: None   Collection Time    07/14/13  6:02 PM      Result Value Ref Range Status   MRSA by PCR NEGATIVE  NEGATIVE Final   Comment:            The GeneXpert MRSA Assay (FDA     approved for NASAL specimens     only), is one component of a     comprehensive MRSA colonization     surveillance program. It is not     intended to diagnose MRSA     infection nor to guide or     monitor treatment for     MRSA infections.     Labs: Basic Metabolic Panel:  Recent Labs Lab 07/14/13 0520 07/14/13 1900 07/15/13 0439 07/16/13 0245 07/17/13 0500  NA 139 139 142 137 142  K 4.3 4.3 3.9 4.6 3.7  CL 93* 93* 94* 90* 95*  CO2 30 31 34* 28 35*  GLUCOSE 162* 138* 146* 316* 61*  BUN 76* 69* 66* 67* 66*  CREATININE 2.95* 2.64* 2.65* 3.03* 2.78*  CALCIUM 9.4 9.4 9.4 9.7 9.8  MG  --   --   --  1.9  --    Liver Function Tests: No results found for this basename: AST, ALT, ALKPHOS, BILITOT, PROT, ALBUMIN,  in the last  168 hours No results found for this basename: LIPASE, AMYLASE,  in the last 168 hours No results found for this basename: AMMONIA,  in the last 168 hours CBC:  Recent Labs Lab 07/13/13 0512 07/14/13 0520 07/16/13 0245  WBC 6.0 5.9 9.1  HGB 8.2* 8.1* 8.1*  HCT 26.7* 26.3* 26.5*  MCV 85.3 86.2 86.0  PLT 252 255 308   Cardiac Enzymes:  Recent Labs Lab 07/11/13 2055 07/12/13 0520 07/12/13 1019  TROPONINI 0.34* 0.36* 0.33*   BNP: BNP (last 3 results)  Recent Labs  07/11/13 2055 07/14/13 0520  PROBNP 30352.0* 16662.0*   CBG:  Recent Labs Lab 07/17/13 2330 07/18/13 0620 07/18/13 0701 07/18/13 0727 07/18/13 0800  GLUCAP 141* 36* 48* 61* 97       Signed:  Mardelle Pandolfi K  [facility] 07/18/2013, 3:00 PM

## 2013-07-18 NOTE — Progress Notes (Signed)
Noted that patient's CBGs this AM were 48-61-97 mg/dl.  Recommend decreasing Levemir to 4 units daily.  Smith Mince RN BSN CDE

## 2013-07-18 NOTE — Progress Notes (Signed)
Staff RN called Diabetes Coordinator concerning Levemir insulin dosage for patient to go home on. Explained that patient needs basal insulin since he is a Type 1.  Recommended to decrease dose to Levemir 4 units daily and check blood sugars frequently at home such as every 4 hours.  Will follow while in hospital.  Smith Mince RN BSN CDE

## 2013-07-18 NOTE — Consult Note (Signed)
I have reviewed this case with our NP and agree with the Assessment and Plan as stated.  Ximenna Fonseca L. Inessa Wardrop, MD MBA [facility] Team Phone: [phone] Pager: [phone]   

## 2013-07-20 LAB — GLUCOSE, CAPILLARY: Glucose-Capillary: 207 mg/dL — ABNORMAL HIGH (ref 70–99)

## 2013-07-29 DEATH — deceased

## 2014-04-08 ENCOUNTER — Encounter (HOSPITAL_COMMUNITY): Payer: Self-pay | Admitting: Internal Medicine

## 2014-08-20 NOTE — Consult Note (Signed)
Brief Consult Note: Diagnosis: right hand cool and painful.   Patient was seen by consultant.   Comments: Very difficult problem.  Clearly the right hand is cool and somewhat pale c/w the left.  There is a strong 4+ right brachial pulse but no palpable radial or ulnar pulses on the right.  On the left there is a 2+ radial pulse noted.  Motor is intact and sensory is largly intact.  Patient has been on telementry and there is no documentation of episodes of Afib making an embolus unlikely.  He could have severe diabetic small vessel disease but I would not expect the right and left to be so mismatched.    Ideally he would be placed on heparin and an agniogram would be done with the hope of intervention and/or surgery.  Given his GI bleed, his acute on chronic renal failure and his acute MI; none of this plan is realistic unless the threat of limb loss is present which it does not appear to be at this time.    I would continue observation and intervene only if tissue loss appears to be an issue.  He is encouraged to exercise his hand.  Electronic Signatures: Levora Dredge (MD)  (Signed 16-Dec-14 18:21)  Authored: Brief Consult Note   Last Updated: 16-Dec-14 18:21 by Levora Dredge (MD)

## 2014-08-20 NOTE — Consult Note (Signed)
Pt with drop in sat with going from 4 to 3 L per canula, indicates fragile status and would not do well with being put to sleep for EGD so will get UGI to rule out anything big and obvious in GI tract.  Electronic Signatures: Scot Jun (MD)  (Signed on 16-Dec-14 07:58)  Authored  Last Updated: 16-Dec-14 07:58 by Scot Jun (MD)

## 2014-08-20 NOTE — Discharge Summary (Signed)
PATIENT NAME:  Joseph Burgess, Joseph Burgess MR#:  [id number] DATE OF BIRTH:  1930/04/03  DATE OF ADMISSION:  02/21/2013 DATE OF DISCHARGE:  02/22/2013  PRIMARY CARE PHYSICIAN: Einar Crow, M.D.    CONSULTANT: Julien Nordmann, cardiology.   DISCHARGE DIAGNOSES:  1. Atrial fibrillation, new onset.  2. 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  3. Diabetic peripheral neuropathy.  4. Diabetic retinopathy.  5. Hypertension.  6. Hypothyroidism.   DISCHARGE MEDICATIONS:  1. Levothyroxine 100 mcg daily.  2. Lovastatin 20 mg daily.  3. Humalog via insulin pump.  4. Coreg 6.25 mg b.i.d.  5. Eliquis 5 mg b.i.d.  6. Diltiazem extended release 120 mg daily.  7. Amiodarone 400 mg b.i.d. for 5 days and then 200 mg b.i.d. thereafter.   HISTORY AND PHYSICAL: This is an 81 year old male who presented to the ER with complaints of rapid heart rate. He was found to be in atrial fibrillation with an initial rate of 112. He did not have any chest pain. Cardiac enzymes were normal. TSH was normal. Electrolytes, including glucose levels, were normal.   HOSPITAL COURSE: The patient was admitted and placed on telemetry. Aspirin was started. Statin was continued. Initially, Lovenox was given as well as IV Cardizem. His heart rate normalized, and he remained in atrial fibrillation throughout his hospitalization. Dr. Mariah Milling, cardiology, evaluated the patient. An echocardiogram was obtained. Dr. Mariah Milling recommended some medication changes with the plan for medical cardioversion. He recommended changing the Coreg to 6.25 b.i.d. and adding diltiazem CD 120 mg and adding amiodarone 400 mg b.i.d. for 5 days and then 200 mg thereafter. Eliquis was added as a blood thinner agent. Losartan/HCTZ and amlodipine were stopped because there was concern that blood pressure would decrease on the higher dose of Coreg and the addition of the diltiazem. The patient was felt safe for discharge with plan for close followup  with cardiology. Final report of the echo is pending at the time of discharge.   DISCHARGE INSTRUCTIONS:  1. Follow up with Dr. Mariah Milling as an outpatient in 2-3 weeks.  2. Stop taking losartan/HCTZ.  3. Stop taking amlodipine.    ____________________________ A. Wendall Mola, MD ams:gb D: 02/22/2013 12:08:53 ET T: 02/22/2013 22:34:00 ET JOB#: [id number]  cc: A. Wendall Mola, MD, <Dictator> Marya Amsler. Dareen Piano, MD Antonieta Iba, MD Caleen Jobs Talajah Slimp MD ELECTRONICALLY SIGNED 02/24/2013 15:53

## 2014-08-20 NOTE — Consult Note (Signed)
Pt without bleeding today.  His heart function greatly decreased compared to a month ago.  Pleural effusion tapped and he feels better.  Left base with better breath sounds and percussion note better. Right side with some dullness to percussion at base.  Hgb stable.  Dr. Jose Persia will do a cardiac test tomorrow to indicate if cath and stent could improve myocardial function.  he asked me if I could do an EGD if the study indicates he should proceed with cath and possible stent.  If that is so then I can do an EGD Monday morning if he approves from a cardiac risk standpoint.  Electronic Signatures: Scot Jun (MD)  (Signed on 11-Dec-14 19:53)  Authored  Last Updated: 11-Dec-14 19:53 by Scot Jun (MD)

## 2014-08-20 NOTE — Consult Note (Signed)
Chief Complaint:  Subjective/Chief Complaint Covering for Dr. Vira Agar. Hgb down to 9.8 from 12 but no signs of bleeding. Breathing better. No CP. No GI sxs now. Scrambled eggs ordered this AM. Na 130. Cr coming down.   VITAL SIGNS/ANCILLARY NOTES: **Vital Signs.:   13-Dec-14 08:00  Vital Signs Type Routine  Temperature Temperature (F) 97.4  Celsius 36.3  Temperature Source oral  Pulse Pulse 80  Respirations Respirations 14  Systolic BP Systolic BP 417  Diastolic BP (mmHg) Diastolic BP (mmHg) 80  Mean BP 99  Pulse Ox % Pulse Ox % 95  Oxygen Delivery High Flow Nasal Cannula  Pulse Ox Heart Rate 80   Brief Assessment:  GEN no acute distress   Cardiac Regular   Respiratory crackles   Gastrointestinal Normal   Lab Results: Routine Chem:  13-Dec-14 03:38   Glucose, Serum  195  BUN  59  Creatinine (comp)  1.71  Sodium, Serum  130  Potassium, Serum 3.5  Chloride, Serum  90  CO2, Serum  35  Calcium (Total), Serum  8.2  Anion Gap  5  Osmolality (calc) 283  eGFR (African American)  43  eGFR (Non-African American)  37 (eGFR values <34m/min/1.73 m2 may be an indication of chronic kidney disease (CKD). Calculated eGFR is useful in patients with stable renal function. The eGFR calculation will not be reliable in acutely ill patients when serum creatinine is changing rapidly. It is not useful in  patients on dialysis. The eGFR calculation may not be applicable to patients at the low and high extremes of body sizes, pregnant women, and vegetarians.)  Routine Hem:  13-Dec-14 03:38   WBC (CBC)  11.1  RBC (CBC)  3.11  Hemoglobin (CBC)  9.8  Hematocrit (CBC)  29.1  Platelet Count (CBC) 234  MCV 94  MCH 31.6  MCHC 33.8  RDW  15.2  Neutrophil % 94.3  Lymphocyte % 1.8  Monocyte % 3.8  Eosinophil % 0.0  Basophil % 0.1  Neutrophil #  10.5  Lymphocyte #  0.2  Monocyte # 0.4  Eosinophil # 0.0  Basophil # 0.0 (Result(s) reported on 11 Apr 2013 at 04:26AM.)    Assessment/Plan:  Assessment/Plan:  Assessment S/P MI. CHF. GI bleeding but no active bleeding right now.   Plan Agree with advancing diet. Continue to moniter hgb and transfuse as needed. PPI daily. No plans for EGD now. WIll follow. THanks.   Electronic Signatures: OVerdie Shire(MD)  (Signed 13-Dec-14 09:07)  Authored: Chief Complaint, VITAL SIGNS/ANCILLARY NOTES, Brief Assessment, Lab Results, Assessment/Plan   Last Updated: 13-Dec-14 09:07 by OVerdie Shire(MD)

## 2014-08-20 NOTE — Consult Note (Signed)
Hgb steady, chest exam about same.  Hgb 10.8, creat 1.4, sat 91% on 4L.  Stress test tomorrow.  No evidence of bleeding. 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  Right had cooler than left, decreased radial pulse.  No new GI recommendations.  Electronic Signatures: Scot Jun (MD)  (Signed on 17-Dec-14 18:11)  Authored  Last Updated: 17-Dec-14 18:11 by Scot Jun (MD)

## 2014-08-20 NOTE — Consult Note (Signed)
Hgb stable, eating real food without problems of bleeding, pain, vomiting.  UGI did not show any ulcer.  Likely bled from gastritis, can't rule out AVM.  I would recommend proceeding with any cardiac intervention that might be appropriate for his cardiac disease while continuing Protonix bid and will switch to oral since he is taking in food well.  In the unlikely event he has a bleed in his GI tract and he is loaded with anti platelet meds and I had to do EGD for bleeding control his heart would hopefully be in better shape than it is now.    Electronic Signatures: Scot Jun (MD)  (Signed on 16-Dec-14 18:17)  Authored  Last Updated: 16-Dec-14 18:17 by Scot Jun (MD)

## 2014-08-20 NOTE — Consult Note (Signed)
See dictated consult note.  No plans for EGD during STWMI/CHF unless active serious bleeding is present.  Treat as bleeding ulcer which has stopped since his hgb has gone from 8 to 9.5 in a week.  BNP of over 10,000, CT of chest suggest pul edema, absense of fever lessens chance of pneumonia.  I will follow with you,  change diet to full liquids to prevent mechanical irritation with bleeding source.  Continue iv protonix for few days then switch to oral.  He does not tolerate oral iron so if needs any it should be given IV in form of iron sucrose infusion 100mg .    Electronic Signatures: Scot Jun (MD)  (Signed on 09-Dec-14 19:01)  Authored  Last Updated: 09-Dec-14 19:01 by Scot Jun (MD)

## 2014-08-20 NOTE — Consult Note (Signed)
General Aspect Mr. Carton is an elderly gentleman who is admitted with severe nausea / vomitting after taking iron tablets.  he was found to have elevated Troponin and CPK / MB levels are we are asked to evaluate.   Present Illness Mr. Stuck was found to have atrial fib and was started on Eliquis.  he was cardioverted but then had to stop the Eliquis because of melanic stool.  He was treated with iron tabs but these caused him to have profound nausea and vomitting.  he was admitted to the hospital for further eval and was then found to have abnormal cardiac enzymes.  he has not had any specific episode of CP.  He has a + family hx of CAD. Non smoker. Worked as a Designer, television/film set for [employer]. No EToh.   Physical Exam:  GEN well developed, well nourished, appears pale   HEENT pale conjunctivae, hearing intact to voice, Oropharynx clear   NECK supple  No masses   RESP normal resp effort  decreased breath sounds in bases.   CARD Regular rate and rhythm  Normal, S1, S2   ABD denies tenderness  soft  normal BS   LYMPH negative neck   EXTR negative edema   SKIN normal to palpation   NEURO cranial nerves deficit, motor/sensory function intact   PSYCH alert, A+O to time, place, person   Review of Systems:  General: No Complaints   Skin: No Complaints   ENT: No Complaints   Cardiovascular: no chest pain   Gastrointestinal: Nausea  Black tarry stools   Genitourinary: No Complaints   Review of Systems: All other systems were reviewed and found to be negative     Anemia:    arthritis:    atrial fibrillation:    Hypothyroidism:    Diabetes Mellitus,Type I (IDD):    Hypertension:     Admission/Visit Status,  Inpatient; Admitting Hospitalist: RADHIKA KALISETTIPreferred Unit: 2A - Telemetry   Diagnosis:  410.70 NSTEMI (non-ST elevated myocardial infarction)  I certify that: I expect the patient to need inpatient services for at least 2 midnights.  Date of  Admission: 07-Apr-2013, 07-Apr-2013, Active, Standard   Full Code, 07-Apr-2013, Active, Standard   Activated PTT, STATDate to Collect:07-Apr-2013, 07-Apr-2013, 1 or more Final Results Received, Standard   B-Type Natriuretic Peptide [facility]), STAT  Draw Date:07-Apr-2013  If dyspnea documented, 07-Apr-2013, 1 or more Final Results Received, Standard   Basic Metabolic Panel (w/Total Calcium), STAT  Draw Date:07-Apr-2013, 07-Apr-2013, 1 or more Final Results Received, Standard   Cardiac Panel, STAT  Draw Date:07-Apr-2013  Includes CK Total and CPK-MB, 07-Apr-2013, 1 or more Final Results Received, Standard   CPK-MB, Serum, STAT  Draw Date:07-Apr-2013  May add to existing specimen, 07-Apr-2013, 1 or more Final Results Received, Standard   Hemogram, Platelet Count, STAT  Draw Date:07-Apr-2013, 07-Apr-2013, 1 or more Final Results Received, Standard   Prothrombin Time, STAT  Draw Date:07-Apr-2013, 07-Apr-2013, 1 or more Final Results Received, Standard   Troponin I, STAT  Draw Date:07-Apr-2013, 07-Apr-2013, 1 or more Final Results Received, Standard   Blood Culture, STAT-Number 1 of 2, 07-Apr-2013, Specimen Received by Performing Department, Standard   Type and Antibody Screen, STAT  Draw Date:07-Apr-2013  Type and Screen and/or Crossmatch good for 3 days., 07-Apr-2013, Corrected Results, Standard   Blood Culture, STAT-Number 2 of 2, 07-Apr-2013, Specimen Received by Performing Department, Standard   CPK-MB, Serum, Timed  Draw Date:07-Apr-2013, 07-Apr-2013, Specimen Received by Performing Department, Standard   Troponin  I, Timed  Draw Date:07-Apr-2013, 07-Apr-2013, Specimen Received by Performing Department, Standard   CPK-MB, Serum, Timed  Draw Date:07-Apr-2013, 07-Apr-2013, Active, Standard   Troponin I, Timed  Draw Date:07-Apr-2013, 07-Apr-2013, Active, Standard   Basic Metabolic Panel (w/Total Calcium), Routine  Draw Date:08-Apr-2013, 08-Apr-2013, Pending, Standard    CBC Profile, Routine  Draw Date:08-Apr-2013, 08-Apr-2013, Pending, Standard   Magnesium, Serum, Routine  Draw Date:08-Apr-2013  add to blood already drawn if available, 08-Apr-2013, Pending, Standard   Sodium Chloride 0.9%, 1000 ml at 60 ml/hr, 07-Apr-2013, Discontinued, Standard   Furosemide injection, ( Lasix injection )  40 mg, IV push, once  Indication: Diuresis, prior to transfusion x 1 dose, 07-Apr-2013, Completed, Standard   Nitroglycerin 2% ointment, 0.5 inch(es) Topical bid  -Indication:Angina/ Hypertension  Instructions:  Apply daily at 6 am, 12 noon and remove at 6 pm to begin 12 hour nitrate free period., 07-Apr-2013, Active, Standard   Nitroglycerin Ointment/Patch - REMOVAL, 6pm, 07-Apr-2013, Active, Standard   Acetaminophen * tablet, ( Tylenol (325 mg) tablet)  650 mg Oral q4h PRN for pain or temp. greater than 100.4  - Indication: Pain/Fever, 07-Apr-2013, Active, Standard   HePARin injection, 5000 unit(s), Subcutaneous, q8h  Indication: Anticoagulant, Monitor Anticoags per hospital protocol, 07-Apr-2013, Active, Standard   Ondansetron injection, ( Zofran injection )  4 mg, IV push, q4h PRN for Nausea/Vomiting  Indication: Nausea/ Vomiting, 07-Apr-2013, Active, Standard   Pantoprazole tablet, 40 mg Oral daily  - Indication: Erosive Esophagitis/ GERD  Instructions:  DO NOT CRUSH, 07-Apr-2013, Discontinued, Standard   Pantoprazole injection,  ( Protonix injection )  40 mg, IV push, q12h  Indication: Erosive Esophagitis/ GERD, 1. Reconstitute Protonix with 63m of Normal Saline; concentration= 418mmL. Once reconstituted; Protonix must be administered within 30 minutes.  2. I.V. line must be flushed before and after administration. Administer [id number].V. push over 2-3 minutes., 07-Apr-2013, Active, Standard   Amiodarone tablet, ( Pacerone)  200 mg Oral daily  - Indication: Tachycardia/ Atrial Fibrillation, 07-Apr-2013, Active, Standard   Lovastatin tablet, (  Mevacor)  20 mg Oral at bedtime  - Indication: Hypercholesterolemia, 07-Apr-2013, Active, Standard   Carvedilol tablet, ( Coreg)  3.125 mg Oral bid  - Indication: CHF/ Hypertension, 07-Apr-2013, Active, Standard   Levothyroxine tablet, ( Synthroid)  0.1 mg Oral q6am  - Indication: Thyroid Hormone Replacement, 07-Apr-2013, Active, Standard   Azithromycin injection, 500 mg in Dextrose 5% 250 ml, IV Piggyback, q24h, Infuse over 60 minute(s)  Indication: Infection, 07-Apr-2013, Active, Standard   cefTRIAXone injection,  ( Rocephin injection )  1 gram, IV Piggyback, q24h, Infuse over 30 minute(s)  Indication: Infection, 07-Apr-2013, Active, Standard   Nursing Saline Flush, 3 to 6 ml, IV push, Q1M PRN for IV Maintenance, 07-Apr-2013, Active, Standard   Nursing Saline Flush, 3 to 6 ml, IV push, Q1M PRN for IV Maintenance, 07-Apr-2013, Active, Standard   Chest PA and Lateral, Stat-STAT-SOB/ chest pain, 07-Apr-2013, 1 or more Final Results Received, Standard   CT Chest Without Contrast, Routine-bilateral pneumonia, 07-Apr-2013, Active, Standard   ED ECG, Stat-Interpreted by-.ordering physician-Reason for ecg -chest pain, 07-Apr-2013, Active, Standard   ED ECG, Stat-Interpreted by-.ordering physician-Reason for ecg -Chest Pain-Mark Lead Placement, 07-Apr-2013, Available for Activation, Standard   Oxygen- ED, -Liters per [place]:2 With Pulse Oximetry 4 Hours-O2 2 LPM [place] for 4 hours with pulse oximetry.  titrate O2 to SPO2> 93%. Discontinue O2 after 4 hours if SPO2> 93%, vital signs stable, and chest pain free., 07-Apr-2013, Active, Standard   Echo Doppler, Interpreted by: KCLupita Leash  Cardiology  CARDIAC Reason for Test: Myocardial Infarction, 07-Apr-2013, Discontinued, Standard   Cardiology Consult, URGENT - requires response within 12 hours  Notify-[place] cardiology on call  Reason for-NSTEMI  Ordering Physican:Kalisetti, [place]  Ordering Physician Contact Number:[phone], 07-Apr-2013, Cancelled,  Standard   Cardiology Consult, ROUTINE - requires response within 24 hours  Notify-[place] cardiology  Reason for-NSTEMI  Ordering Physican:Kalisetti, [place]  Ordering Physician Contact Number:[phone], 07-Apr-2013, Pending, Standard   Gastroenterology Consult, ROUTINE - requires response within 24 hours  Notify-Dr. Tiffany Kocher  Reason for-melena  Ordering Physican:Kalisetti, [place]  Ordering Physician Contact Number:[phone], 07-Apr-2013, Pending, Standard   Sodium Restrictive Diet, 2 Grams Sodium, 07-Apr-2013, Active, Standard   Vital Signs, Q 5-30"; until stable, then Q 1hr.-Q 5-30"; until stable, then Q 1hr.  BP, Pulse, Pain score Q 5-8mn; record until stable, then Q 1hr and with chest pain or change in condition until transfer/admission.  Continuous cardiac monitoring., 07-Apr-2013, Active, Standard   I&O, Measure and Record Q 8hr, 07-Apr-2013, Active, Standard   Pneumatic/Compression Hose (SCD), 07-Apr-2013, Active, Standard   TED Hose Knee High, 07-Apr-2013, Active, Standard   Vital Signs, Q 8hr, 07-Apr-2013, Active, Standard   Bleeding Precautions Protocol, 07-Apr-2013, Active, Standard   FSBS No Insulin Coverage, Freq-before meals and at bedtime-Call MD BS < 60-Call MD BS > 300, 07-Apr-2013, Discontinued, Standard   FSBS No Insulin Coverage, Freq-before meals and at bedtime-Call MD BS < 60-Call MD BS > 300-TIDM, pt has his own testing supples and an insulin pod, he will check his own sugar as needed for t, 07-Apr-2013, Active, Standard   Hygiene, 07-Apr-2013, Active, Standard   Room Service Appropriate, Not at this time, 07-Apr-2013, Active, Standard   Room Service Appropriate, Not at this time, 07-Apr-2013, Active, Standard   Activity, Activity as tolerated, encourage ea, 07-Apr-2013, Active, Standard   ED Cardiac Monitor, with Continuous pulse ox, 07-Apr-2013, Active, Standard   Nursing Miscellaneous Item, Establish IV Access, flush with NS, 07-Apr-2013,  Completed, Standard   Obtain Documentation of Consent for blood transfusion, 07-Apr-2013, Completed, Standard   Transfuse .unit(s) of pRBC:, Units to transfuse: 1-Transfuse Each Over 4 hours, 07-Apr-2013, Pending, Standard   CKMB Q 4 hr x 3, Unit Clerk or Nurse to enter timed lab orders., 07-Apr-2013, Pending, Standard   Troponin Q 4 hrs x 3, Unit Clerk or Nurse to enter timed lab orders., 07-Apr-2013, Pending, Standard   Clinical Parameters Reviewed, (such as vital signs, lab results, etc) as appropriate for the medication(s) being administered, 07-Apr-2013, Active, Standard   Clinical Parameters Reviewed, (such as vital signs, lab results, etc) as appropriate for the medication(s) being administered, 07-Apr-2013, Active, Standard   Universal Skin Precautions, 07-Apr-2013, Active, Standard  Home Medications: Medication Instructions Status  Synthroid 100 mcg (0.1 mg) oral tablet 1 tab(s) orally once a day before breakfast Active  amiodarone 200 mg oral tablet 1 tab(s) orally once a day Active  carvedilol 6.25 mg oral tablet 1 tab(s) orally once a day (in the morning) and 0.5 tablet (3.125 mg) every evening, As Needed for high blood pressure Active  diltiazem 30 mg oral tablet 1 tab(s) orally once a day, As Needed for high blood pressure Active  HumaLOG 100 units/mL subcutaneous solution  subcutaneous via insulin pod Active  lovastatin 20 mg oral tablet 1 tab(s) orally once a day (at bedtime) Active   Lab Results: Routine BB:  09-Dec-14 14:50   ABO Group + Rh Type A Positive  Antibody Screen NEGATIVE (Result(s) reported on 07 Apr 2013 at  04:02PM.)  Routine Chem:  09-Dec-14 13:16   Result Comment TROPONIN - RESULTS VERIFIED BY REPEAT TESTING.  - NOTIFIED [place] ON 04/07/13 @ 1419  - .Marland KitchenMarland Kitchenqsd  - READ-BACK PROCESS PERFORMED.  Result(s) reported on 07 Apr 2013 at 02:21PM.  Glucose, Serum  178  BUN  49  Creatinine (comp)  1.69  Sodium, Serum 137  Potassium, Serum 4.8  Chloride,  Serum 101  CO2, Serum 29  Calcium (Total), Serum 9.7  Anion Gap 7  Osmolality (calc) 291  eGFR (African American)  43  eGFR (Non-African American)  37 (eGFR values <24m/min/1.73 m2 may be an indication of chronic kidney disease (CKD). Calculated eGFR is useful in patients with stable renal function. The eGFR calculation will not be reliable in acutely ill patients when serum creatinine is changing rapidly. It is not useful in  patients on dialysis. The eGFR calculation may not be applicable to patients at the low and high extremes of body sizes, pregnant women, and vegetarians.)  B-Type Natriuretic Peptide ([facility]  10561 (Result(s) reported on 07 Apr 2013 at 02:10PM.)  Cardiac:  09-Dec-14 13:16   CK, Total 156  CPK-MB, Serum  13.1 (Result(s) reported on 07 Apr 2013 at 03:50PM.)  CPK-MB, Serum  12.2 (Result(s) reported on 07 Apr 2013 at 02:52PM.)  Troponin I  2.40 (0.00-0.05 0.05 ng/mL or less: NEGATIVE  Repeat testing in 3-6 hrs  if clinically indicated. >0.05 ng/mL: POTENTIAL  MYOCARDIAL INJURY. Repeat  testing in 3-6 hrs if  clinically indicated. NOTE: An increase or decrease  of 30% or more on serial  testing suggests a  clinically important change)  Routine Coag:  09-Dec-14 13:16   Prothrombin 13.5  INR 1.0 (INR reference interval applies to patients on anticoagulant therapy. A single INR therapeutic range for coumarins is not optimal for all indications; however, the suggested range for most indications is 2.0 - 3.0. Exceptions to the INR Reference Range may include: Prosthetic heart valves, acute myocardial infarction, prevention of myocardial infarction, and combinations of aspirin and anticoagulant. The need for a higher or lower target INR must be assessed individually. Reference: The Pharmacology and Management of the Vitamin K  antagonists: the seventh ACCP Conference on Antithrombotic and Thrombolytic Therapy. CUEKCM.0349Sept:126 (3suppl): [id number] A HCT  value >55% may artifactually increase the PT.  In one study,  the increase was an average of 25%. Reference:  "Effect on Routine and Special Coagulation Testing Values of Citrate Anticoagulant Adjustment in Patients with High HCT Values." American Journal of Clinical Pathology 2006;126:400-405.)  Activated PTT (APTT) 27.2 (A HCT value >55% may artifactually increase the APTT. In one study, the increase was an average of 19%. Reference: "Effect on Routine and Special Coagulation Testing Values of Citrate Anticoagulant Adjustment in Patients with High HCT Values." American Journal of Clinical Pathology 2006;126:400-405.)  Routine Hem:  09-Dec-14 13:16   WBC (CBC) 7.0  RBC (CBC)  2.92  Hemoglobin (CBC)  9.5  Hematocrit (CBC)  27.8  Platelet Count (CBC) 295 (Result(s) reported on 07 Apr 2013 at 01:43PM.)  MCV 95  MCH 32.4  MCHC 34.0  RDW  14.7   Radiology Results: XRay:    09-Dec-14 14:06, Chest PA and Lateral  Chest PA and Lateral   REASON FOR EXAM:    SOB/ chest pain  COMMENTS:       PROCEDURE: DXR - DXR CHEST PA (OR AP) AND LATERAL  - Apr 07 2013  2:06PM     CLINICAL DATA:  Cough, shortness  of Breath    EXAM:  CHEST  2 VIEW    COMPARISON:  02/21/2013    FINDINGS:  Cardiomediastinal silhouette is stable. Bilateral perihilar  infiltrates with mild peribronchial thickening highly suspicious for  bilateral pneumonia.     IMPRESSION:  Bilateral perihilar infiltrates with peribronchial thickening highly  suspicious for bilateralpneumonia. Follow-up to resolution is  recommended.      Electronically Signed    By: Lahoma Crocker M.D.    On: 04/07/2013 14:22         Verified By: Ephraim Hamburger, M.D.,    COLD MEDICATION: Rash  Ornade: Unknown  Vital Signs/Nurse's Notes: **Vital Signs.:   09-Dec-14 16:57  Vital Signs Type Admission  Temperature Temperature (F) 97.4  Celsius 36.3  Temperature Source oral  Pulse Pulse 81  Respirations Respirations 18  Systolic BP  Systolic BP 737  Diastolic BP (mmHg) Diastolic BP (mmHg) 82  Mean BP 100  Pulse Ox % Pulse Ox % 98  Pulse Ox Activity Level  At rest  Oxygen Delivery 3L    Impression Mr. Nydam presents with several episodes of nausea and vomitting - which he attributes to taking the iron tabs. he states the N/V follows the iron tab EVERY time.   he has not had any angina symptoms.  He is moderately anemic and also his CXR shows bilateral infiltrates - possibly due to pneumonia.   Despite being pale, he overall looks pretty good and does not look like someone who has had an acute coronary syndrome.  He is very comfortable at present and I do not think he has any ongoing ischemia.   I would favor doing the GI evaluation before we do any invasive cath procedure.  If he needs a PCI, we will need to place him on several strong platelet inhibitors which will likely restart his GI bleeding.  I agree with plans to do a CT angio to look for pulmonary embolus.   we will get a repeat echo - if he has new segmental wall motion abnormalities - that would be helpful to know.  if his LV function is normal, that would suggest a more global supply/demand issue to explain his elevated cardiac enzymes.   Electronic Signatures: Brax Walen, Dreama Saa (MD)  (Signed 09-Dec-14 18:23)  Authored: General Aspect/Present Illness, History and Physical Exam, Review of System, Past Medical History, Orders, Home Medications, Labs, Radiology, Allergies, Vital Signs/Nurse's Notes, Impression/Plan   Last Updated: 09-Dec-14 18:23 by Acie Fredrickson Dreama Saa (MD)

## 2014-08-20 NOTE — H&P (Signed)
PATIENT NAME:  Joseph Burgess, Joseph Burgess MR#:  [id number] DATE OF BIRTH:  [birth year]  DATE OF ADMISSION:  02/21/2013  PRIMARY CARE PHYSICIAN:  Dr. Einar Crow.   REFERRING PHYSICIAN: Dr. Mayford Knife  CHIEF COMPLAINT:  Rapid heart rate today.   HISTORY OF PRESENT ILLNESS: An 81 year old Caucasian male with a history of hypertension, diabetes 1 on insulin pump, hypothyroidism, GI bleeding, who presented to the ED with the above chief complaint. The patient is alert, awake, oriented, in no acute distress. Accordingly to the patient, he measured his blood pressure and found a rapid heart rate at 112.  The patient's baseline heart rate is about 50s but the patient denies any chest pain, palpitations. No orthopnea or nocturnal dyspnea. No leg edema. The patient denies any cough, sputum or shortness of breath. The patient came to the ED for further evaluation and was noted to have AFib with RVR.   PAST MEDICAL HISTORY: Hypertension, diabetes 1, hypothyroidism, GI bleeding 4 years ago.   PAST SURGICAL HISTORY: None.  SOCIAL HISTORY: No smoking, alcohol drinking or illicit drugs.   FAMILY HISTORY: Brother had a heart attack.   ALLERGIES: COLD MEDICINE, ORNADE.   HOME MEDICATIONS:  1.  Synthroid 100 mcg p.o.  2.  Lovastatin 20 mg p.o. daily.  3.  Hyzaar 100 mg/12.5 mg p.o. 0.5 tablets once a day.  4.  Insulin pump.  5.  Coreg 6.25 mg p.o. daily at bedtime.  6.  Coreg 3.125 mg once a day.  7.  Norvasc 5 mg p.o. daily.   REVIEW OF SYSTEMS:  CONSTITUTIONAL: The patient denies any fever or chills. No headache or dizziness. No weakness.  EYES: No double vision or blurred vision.  EARS, NOSE, THROAT: No postnasal drip, slurred speech or dysphagia.  CARDIOVASCULAR: No chest pain, palpitations. No orthopnea or nocturnal dyspnea. No leg edema.  PULMONARY: No cough, sputum, shortness of breath or hemoptysis.  GASTROINTESTINAL: No abdominal pain, nausea, vomiting or diarrhea. No melena or bloody stool.   GENITOURINARY: No dysuria, hematuria, or incontinence.  SKIN: No rash or jaundice.  NEUROLOGY: No syncope, loss of consciousness or seizure.  HEMATOLOGY: No easy bruising or bleeding.  ENDOCRINE: No polyuria or polydipsia, heat or cold intolerance.   PHYSICAL EXAMINATION: VITAL SIGNS: Temperature 97.6, blood pressure 152/88, pulse 108, oxygen saturation 96% on room air.  GENERAL: The patient is alert, awake, oriented, in no acute distress.  HEENT: Pupils round, equal and reactive to light and accommodation. Moist oral mucosa. Clear oropharynx.  NECK: Supple. No JVD or carotid bruits. No lymphadenopathy. No thyromegaly.  CARDIOVASCULAR: S1, and S2. Irregular rate and rhythm. No murmurs, gallop.  PULMONARY: Bilateral air entry. No wheezing or rales. No use of accessory muscle to breathe.  EXTREMITIES: Bilateral lower extremity edema 1+. No clubbing or cyanosis. No calf tenderness. Strong bilateral pedal pulses.  SKIN: No rash or jaundice.  NEUROLOGIC: A and O x 3. No focal deficit. Power 5/5. Sensation intact.   LABORATORY DATA: Urinalysis is negative. Glucose of 73, BUN 20, creatinine 1.25, electrolytes normal. CBC in normal range. Troponin less than 0.02. TSH 3.3, INR 1.0. EKG shows AFib at 96 bpm.   IMPRESSIONS: 1.  New-onset atrial fibrillation with rapid ventricular response.  2.  Hypertension.  3.  Diabetes 1.  4.  Hypothyroidism.   PLAN OF TREATMENT: 1.  The patient will be placed for observation. We will continue telemonitor, give aspirin and statin. We will start Lovenox 1 mg/kg q.12 hours subcutaneous. Cardizem iv then po q6  hours. We will get an echocardiograph and a cardiology consult. The patient prefers Dr. Mariah Milling as his cardiologist.  2.  For the diabetes we will continue insulin pump. Check hemoglobin A1c.  3.  We will increase Coreg to 6.25 mg p.o. b.i.d. and continue Hyzaar and Norvasc.  4.  I discussed the patient's condition and plan of treatment with the patient and  the patient's wife. Also discussed the patient may need anticoagulation treatment, but need to follow up Cardiologist's recommendations.  5.  The patient is a FULL CODE.   TIME SPENT: About 55 minutes.    ____________________________ Shaune Pollack, MD qc:dp D: 02/21/2013 16:43:00 ET T: 02/21/2013 17:01:25 ET JOB#: [id number]  cc: Shaune Pollack, MD, <Dictator> Shaune Pollack MD ELECTRONICALLY SIGNED 02/25/2013 17:55

## 2014-08-20 NOTE — Consult Note (Signed)
Chief Complaint:  Subjective/Chief Complaint Breathing better. Still on 50% O2. No bleeding. Tolerating solids ok. EF only 25%. Hgb up compared to yesterday.   VITAL SIGNS/ANCILLARY NOTES: **Vital Signs.:   14-Dec-14 06:00  Vital Signs Type Routine  Pulse Pulse 80  Respirations Respirations 14  Systolic BP Systolic BP 161  Diastolic BP (mmHg) Diastolic BP (mmHg) 56  Mean BP 76  Pulse Ox % Pulse Ox % 95  Oxygen Delivery High Flow Nasal Cannula  Pulse Ox Heart Rate 80    07:00  Vital Signs Type Routine   Brief Assessment:  GEN no acute distress   Cardiac Regular   Respiratory crackles   Lab Results: Routine Chem:  14-Dec-14 02:52   Magnesium, Serum  1.6 (1.8-2.4 THERAPEUTIC RANGE: 4-7 mg/dL TOXIC: > 10 mg/dL  -----------------------)  Glucose, Serum  195  BUN  53  Creatinine (comp)  1.46  Sodium, Serum  134  Potassium, Serum  3.0  Chloride, Serum  91  CO2, Serum  39  Calcium (Total), Serum  8.3  Anion Gap  4  Osmolality (calc) 288  eGFR (African American)  52  eGFR (Non-African American)  44 (eGFR values <74m/min/1.73 m2 may be an indication of chronic kidney disease (CKD). Calculated eGFR is useful in patients with stable renal function. The eGFR calculation will not be reliable in acutely ill patients when serum creatinine is changing rapidly. It is not useful in  patients on dialysis. The eGFR calculation may not be applicable to patients at the low and high extremes of body sizes, pregnant women, and vegetarians.)  Routine Hem:  14-Dec-14 02:52   WBC (CBC)  12.4  RBC (CBC)  3.39  Hemoglobin (CBC)  10.7  Hematocrit (CBC)  31.5  Platelet Count (CBC) 242  MCV 93  MCH 31.4  MCHC 33.8  RDW  15.6  Neutrophil % 94.9  Lymphocyte % 1.4  Monocyte % 3.3  Eosinophil % 0.0  Basophil % 0.4  Neutrophil #  11.8  Lymphocyte #  0.2  Monocyte # 0.4  Eosinophil # 0.0  Basophil # 0.0 (Result(s) reported on 12 Apr 2013 at 03:30AM.)   Assessment/Plan:   Assessment/Plan:  Assessment CHF. S/P MI with drop in EF. GI bleed, which has since stopped.   Plan EGD when stable before cath. Dr. EVira Agarto see patient tomorrow. Thanks.   Electronic Signatures: OVerdie Shire(MD)  (Signed 14-Dec-14 09:04)  Authored: Chief Complaint, VITAL SIGNS/ANCILLARY NOTES, Brief Assessment, Lab Results, Assessment/Plan   Last Updated: 14-Dec-14 09:04 by OVerdie Shire(MD)

## 2014-08-20 NOTE — Consult Note (Signed)
No further bleeding today since one episode of BRB with bowel movement.  He does say this has happened before.  VSS.  Likely bled from internal hemorrhoids.  Hgb ordered for tomorrow.  could do flex sig tomorrow if needed.  Electronic Signatures: Scot Jun (MD)  (Signed on 18-Dec-14 17:09)  Authored  Last Updated: 18-Dec-14 17:09 by Scot Jun (MD)

## 2014-08-20 NOTE — Consult Note (Signed)
Pt with recent MI, CHF, pleural effusion, possible pneumonia, feels better, breathing better, no chest pain.  Chest exam shows dullness at leftt base and some crackles in anterior left field.  With his right side doing better it is less stressful for an EGD than to be on opposite side.  Will talk to anesthesia and see if can do EGD tomorrow late morning.   Electronic Signatures: Scot Jun (MD)  (Signed on 15-Dec-14 13:10)  Authored  Last Updated: 15-Dec-14 13:10 by Scot Jun (MD)

## 2014-08-20 NOTE — Consult Note (Signed)
Pt had altered consciousness this morning and snapped out of it later after CT showed no acute infarct.  TIA?  No bleeding, hgb stable at 11.6.  Exam shows decreased brath sounds ar left base, maybe worse than 2-3 days ago.  sat 93% on 4L.  No new recommendations.  Electronic Signatures: Scot Jun (MD)  (Signed on 19-Dec-14 18:42)  Authored  Last Updated: 19-Dec-14 18:42 by Scot Jun (MD)

## 2014-08-20 NOTE — Consult Note (Signed)
PATIENT NAME:  Joseph Burgess, Joseph Burgess MR#:  [id number] DATE OF BIRTH:  1929-05-07  DATE OF CONSULTATION:  04/07/2013  REFERRING PHYSICIAN:   CONSULTING PHYSICIAN:  Scot Jun, MD  HISTORY OF PRESENT ILLNESS:  The patient is an 81 year old white male who had recent history of atrial fibrillation. This was successfully cardioverted in November. He was put on Eliquis, however, he developed melena about 3 or 4 days after the Eliquis was started and was found to have a hemoglobin of 8 by Dr. Mariah Milling. The Eliquis was stopped and he was seen in our office and his hemoglobin was up to 8.9 and he was placed on iron pills. He developed severe nausea, belching and indigestion after taking the iron and he contacted nurse practitioner, Fransico Setters in our office, this morning. She had seen him the day before. He was actually given the iron last week and he contacted the office this morning complaining of nausea, belching,  indigestion and some coughing and a little shortness of breath. She instructed him to go to the ER where he was evaluated and found to have abnormal chest x-ray and an elevated troponin of 2.4, a CPK MB of 12.2. His hemoglobin was further up at 9.5. His stool was positive for guaiac. He was admitted to the hospital for a possible non-ST wave MI and I was asked to see him in consultation.   PAST MEDICAL HISTORY: History of Dieulafoy lesion in the colon resulting in severe GI bleeding, successfully clipped. History of recent atrial fibrillation, status post successful cardioversion in November 2014. Hypothyroidism, diabetes for over 60 years, currently has a device in his right arm of that controls his blood sugar. History of hypertension, history of surgery, bilateral cataracts.   ALLERGIES: "COLD MEDICINE" AND ORNADE.   MEDICINE AT HOME: Insulin pump with Humalog, lovastatin 20 mg a day, Synthroid 100 mcg a day, diltiazem 30 mg a day, carvedilol 6.25 in the morning and 3.125 at bedtime, amiodarone 200 mg  a day.   FAMILY HISTORY: Positive for heart disease.   REVIEW OF SYSTEMS: He has had a little coughing. No chest pains. He does get short of breath with walking. He has had nausea and belching and indigestion and melena He has had anemia, hemoglobin got down to 8, today it was 9.5.   PHYSICAL EXAMINATION: GENERAL: White male in no acute distress, wearing oxygen.  VITAL SIGNS: Temp 97.4, pulse 80, respirations 18, blood pressure 136/82, 98% sat on 3 liters.  HEENT: Sclerae are anicteric. Conjunctivae somewhat pale. The head is atraumatic.  NECK:  Trachea is in the midline.  CHEST:  Shows a few faint crackles at both bases.  HEART:  Shows a 1/6 systolic murmur.  ABDOMEN: No hepatosplenomegaly. No masses. No bruits. No significant tenderness   LABORATORY, DIAGNOSTIC AND RADIOLOGICAL DATA: Glucose 178, BNP 10,560, BUN 49, creatinine 1.69, sodium 137, potassium 4.8, chloride 101, CO2 29, calcium 9.7. Troponin is 3, the initial troponin was 2.4. CPK-MB is 14.5, CPK-MB on admission was 12.2. White count 7000, hemoglobin 9.5, hematocrit 27.8, platelet count 295. A positive blood with a negative antibody screen. Pro time 13.5. A chest x-ray shows bilateral perihilar infiltrates suspicious for bilateral pneumonia, also possibly could be due to congestive heart failure. A CAT scan of the chest shows perihilar airspace opacities with moderate to large bilateral pleural effusions. Differential includes pulmonary edema, aspiration pneumonia or atypical pneumonia. Given his BNP elevation and his lack of fever, it is most likely to be due  to congestive heart failure.   ASSESSMENT AND PLAN:  Melena after starting Eliquis, likely due to ulcer disease which has been asymptomatic up until now or erosive gastritis. The possibility of a second Dieulafoy's lesion in the upper gastrointestinal tract certainly is to be considered. If he has episodes of sudden gastrointestinal bleeding, he may require an urgent endoscopy.    At this time, since his hemoglobin has risen from 8 to 9.5 in less than a week, I would be very reluctant to attempt an upper endoscopy in the face of a non-ST wave myocardial infarction unless the patient were actively bleeding. The risk of morbidity mortality doing an upper endoscopy in the face of an acute myocardial infarction is probably around 25%. If there is evidence of active aggressive bleeding, then I will have no choice but to perform an urgent endoscopy. For the moment, I recommend treatment with IV pantoprazole and switching to oral in a few days. There are no plans to do endoscopy at this time. We will follow with you.   ____________________________ Scot Jun, MD rte:cs D: 04/07/2013 18:53:44 ET T: 04/07/2013 19:10:28 ET JOB#: [id number]  cc: Scot Jun, MD, <Dictator> Enid Baas, MD Marya Amsler. Dareen Piano, MD Antonieta Iba, MD Scot Jun MD ELECTRONICALLY SIGNED 04/11/2013 10:26

## 2014-08-20 NOTE — Consult Note (Signed)
Chart reviewed, cardiology and [facility] notes reviewed, examined patient and spoke with family.  PE shows left effusion and bibasal rales.  CXR shows free flowing pleural effusion.  Hgb up to 10.9.  after one unit.  no nausea or vomiting.  Some abd discomfort when he moves about.  Toponin and CPK MB noted.  He is to have pulmonary consult and possible pleural effusion tap.  Continue full liquid diet and iv Protonix for now.  No plans for EGD unless he shows active bleeding.  Electronic Signatures: Scot Jun (MD)  (Signed on 10-Dec-14 17:21)  Authored  Last Updated: 10-Dec-14 17:21 by Scot Jun (MD)

## 2014-08-20 NOTE — Consult Note (Signed)
   Comments   I met with pt in the presence of his wife, daughter and son. Daughter-in-law, who is a critical care RN, helped facilitate discussion. Pt is now off bipap, on HFNC, appears much more comfortable and is able to fully participate in discussion. Pt and family have been discussing code status. Pt has decided that he would want intubation short-term if needed. However, he would not want to be on vent longer than 2-3 days. He also wants an attempt at resuscitation in the event of cardiac arrest. Therefore, pt remains a full code. Pt and wife both seem comfortable with this decision.   Electronic Signatures: Everleigh Colclasure, Izora Gala (MD)  (Signed 12-Dec-14 16:30)  Authored: Palliative Care   Last Updated: 12-Dec-14 16:30 by Lauretta Sallas, Izora Gala (MD)

## 2014-08-20 NOTE — Consult Note (Signed)
CC: MI,pt doing well, VSS afebrile, sat 92 on 2L, which is improved, chest without crackles now,, renal funct stable, no bleeding, appetite excellent, cleaned his breakfast plate.  When he goes home please discharge on bid PPI and follow up in office with me in 2-3 weeks.  Electronic Signatures: Scot Jun (MD)  (Signed on 21-Dec-14 09:16)  Authored  Last Updated: 21-Dec-14 09:16 by Scot Jun (MD)

## 2014-08-20 NOTE — Discharge Summary (Signed)
PATIENT NAME:  Joseph Burgess, Joseph Burgess MR#:  [id number] DATE OF BIRTH:  1929-11-25  DATE OF ADMISSION:  04/07/2013 DATE OF DISCHARGE: 12/22/104   DISCHARGE DIAGNOSES:  1.  Acute respiratory failure.  2.  Congestive heart failure.  3.  Ischemic cardiomyopathy.  4.  Acute renal failure.  5.  Gastrointestinal bleed with blood loss anemia secondary to lower gastrointestinal bleed.  6.  Atrial fibrillation with amiodarone stopped.  7.  Pneumonia.  8.  Type 1 diabetes, on insulin. 9.  Chronic kidney disease, stage III secondary to diabetes.  10. Acute myocardial infarction, non-ST-elevated.   DISCHARGE MEDICATIONS: Please see med reconciliation on the [facility] form for details. Briefly, on a prednisone taper for potential amiodarone toxicity as well as torsemide 20 mg, potassium and losartan was added as carvedilol was decreased.   HISTORY AND PHYSICAL: Please see detailed history and physical done on admission.   HOSPITAL COURSE: The patient was admitted with a large pleural effusion, shortness of breath, elevated troponins. Pulmonary status worsened with steroids added. Thoracentesis was done, which showed a transudate. He was ultimately put a furosemide infusion, which did seem to help immensely. He was on BiPAP for a short period of time. He wished to be a full code. He slowly improved from this with his oxygenation. He was weaned to a nasal cannula, which is down to 2 L. At this point, he will likely need home oxygen. Room air will be checked again today to ensure this is the case. Lexiscan Myoview was done, which showed multiple areas of decreased perfusion, possibly scars. Cardiac catheterization was going to be done, but lower GI bleed developed. Hemoglobin is stable at around 10 for 3 or 4 days now. Given he had an upper GI bleed a month or so ago, cardiac catheterization was postponed for the multiple GI bleeds as noted. Acute renal failure developed. Creatinine was down to 1.44, the latest prior to  discharge. It was in the 2.5 range previously. Last hemoglobin was 10.3. No signs of GI bleeding multiple days as noted. Glucose was somewhat erratic with steroids as noted. He had no atrial fibrillation off of the amiodarone. He did have an emboli to his right arm, which thankfully cleared spontaneously. He will be seen by cardiology prior to discharge if feasible. He will be seen soon by cardiology as well and seen in my office.    TIME SPENT: It took approximately 45 minutes to do all discharge tasks today.  ____________________________ Marya Amsler. Dareen Piano, MD mwa:aw D: 04/20/2013 07:32:30 ET T: 04/20/2013 08:59:13 ET JOB#: [id number]  cc: Marya Amsler. Dareen Piano, MD, <Dictator> Lauro Regulus MD ELECTRONICALLY SIGNED 04/20/2013 12:24

## 2014-08-20 NOTE — Consult Note (Signed)
CC: GI bleed likely from gastritis or PUD.  also had anal outlet bleed one day.  Mild elevated SGPT and SGOT, alb 2.3, creat steady at 1.47.  Chest exam stable with decreased breath sounds on left base.  Cardiologist felt he could go home.  Recommend ASA 81mg  with food.  Electronic Signatures: Scot Jun (MD)  (Signed on 20-Dec-14 13:31)  Authored  Last Updated: 20-Dec-14 13:31 by Scot Jun (MD)

## 2014-08-20 NOTE — Consult Note (Signed)
General Aspect 81 year old Caucasian male with a history of hypertension, diabetes 1 on insulin pump, hypothyroidism, GI bleeding (4 years ago requiring clipping, AV malformation), who presented to the ED with tachycardia.Cardiology was consulted for new atrial fibrillation.   At home, he measured his blood pressure and found a rapid heart rate at 112. he typically measures his heart rate and BP daily. On thursday, his BP and heart rate was normal range. Friday, his HR was elevated.   baseline heart rate is in the  50s but the patient denies any chest pain, palpitations. No orthopnea or nocturnal dyspnea. No leg edema. The patient denies any cough, sputum or shortness of breath.On wednesday, he was lifting a trailer and he hurt hie right back and shoulder. This has hurt over the past few days.   The patient came to the ED for further evaluation and was noted to have AFib with RVR.    PAST MEDICAL HISTORY: Hypertension, diabetes 1, hypothyroidism, GI bleeding 4 years ago.   PAST SURGICAL HISTORY: None.  SOCIAL HISTORY: No smoking, alcohol drinking or illicit drugs.   FAMILY HISTORY: Brother had a heart attack.   Physical Exam:  GEN well developed, well nourished, no acute distress   HEENT hearing intact to voice, moist oral mucosa   NECK supple   RESP normal resp effort  clear BS   CARD Irregular rate and rhythm  No murmur   ABD soft   LYMPH negative neck   EXTR negative edema   SKIN normal to palpation   NEURO motor/sensory function intact   PSYCH alert, A+O to time, place, person, good insight   Review of Systems:  Subjective/Chief Complaint No complaints   General: No Complaints   Skin: No Complaints   ENT: No Complaints   Eyes: No Complaints   Neck: No Complaints   Respiratory: No Complaints   Cardiovascular: No Complaints   Gastrointestinal: No Complaints   Genitourinary: No Complaints   Vascular: No Complaints   Musculoskeletal: No Complaints    Neurologic: No Complaints   Hematologic: No Complaints   Endocrine: No Complaints   Psychiatric: No Complaints   Review of Systems: All other systems were reviewed and found to be negative   Medications/Allergies Reviewed Medications/Allergies reviewed   Home Medications: Medication Instructions Status  Hyzaar 100 mg-12.5 mg oral tablet 0.5  orally once a day  Active  Humalog 100 units/mL subcutaneous injection  Active  lovastatin 20 mg oral tablet  Active  carvedilol 3.125 mg oral tablet 1 tab(s) orally once a day Active  carvedilol 6.25 mg oral tablet 1 tab(s) orally once a day (at bedtime) Active  amLODIPine 5 mg oral tablet 1 tab(s) orally once a day Active  Synthroid 100 mcg (0.1 mg) oral tablet 1 tab(s) orally once a day Active   Lab Results:  Thyroid:  26-Oct-14 05:01   Thyroid Stimulating Hormone 3.12 (0.45-4.50 (International Unit)  ----------------------- Pregnant patients have  different reference  ranges for TSH:  - - - - - - - - - -  Pregnant, first trimetser:  0.36 - 2.50 uIU/mL)  Routine Chem:  26-Oct-14 05:01   Glucose, Serum  133  BUN  20  Creatinine (comp) 1.17  Sodium, Serum 140  Potassium, Serum 4.5  Chloride, Serum 105  CO2, Serum 32  Calcium (Total), Serum 9.2  Anion Gap  3  Osmolality (calc) 284  eGFR (African American) >60  eGFR (Non-African American)  58 (eGFR values <49m/min/1.73 m2 may be an indication  of chronic kidney disease (CKD). Calculated eGFR is useful in patients with stable renal function. The eGFR calculation will not be reliable in acutely ill patients when serum creatinine is changing rapidly. It is not useful in  patients on dialysis. The eGFR calculation may not be applicable to patients at the low and high extremes of body sizes, pregnant women, and vegetarians.)  Magnesium, Serum  1.5 (1.8-2.4 THERAPEUTIC RANGE: 4-7 mg/dL TOXIC: > 10 mg/dL  -----------------------)  Hemoglobin A1c ([facility])  7.1 (The American  Diabetes Association recommends that a primary goal of therapy should be <7% and that physicians should reevaluate the treatment regimen in patients with HbA1c values consistently >8%.)  Cholesterol, Serum 124  Triglycerides, Serum 58  HDL (INHOUSE) 57  VLDL Cholesterol Calculated 12  LDL Cholesterol Calculated 55 (Result(s) reported on 22 Feb 2013 at 05:50AM.)   EKG:  Interpretation EKG shows atrial fibrillation with rate 96 bpm, no significant St or T wave changes    COLD MEDICATION: Rash  Ornade: Unknown  Vital Signs/Nurse's Notes: **Vital Signs.:   26-Oct-14 07:31  Vital Signs Type Q 4hr  Temperature Temperature (F) 98.1  Celsius 36.7  Temperature Source oral  Pulse Pulse 76  Respirations Respirations 18  Systolic BP Systolic BP 287  Diastolic BP (mmHg) Diastolic BP (mmHg) 71  Mean BP 86  Pulse Ox Activity Level  At rest  Oxygen Delivery Room Air/ 21 %    Impression 80 year old Caucasian male with a history of hypertension, diabetes 1 on insulin pump, hypothyroidism, GI bleeding (4 years ago requiring clipping, AV malformation), who presented to the ED with tachycardia.Cardiology was consulted for new atrial fibrillation.   1) Atrial fibrillation: Suspect new onset on Friday, asymptomatic, rate >110 bpm. Recent loose BMs starting wednesday ("happens from time to time") Magnesium low --Echo pending to rule out structural disease. --As he has no significant sx, no urgent need for cardioversion, could try meds first. --Discussed options with him, will continue diltiazem cd 120 mg daily, could continue coreg 6.25 mg BID, start amiodarone in attempt to pharmacologically convert (400 mg po BID for 5 days, then 200 mg po BID) --C0upon for eliquis provided. He can start Eliquis 5 mg po BID starting tonight as he recieved a lovenox shot already this AM.   2) HTN: Will hold losartan HCT and norvasc now and at dischage as BP lower this AM on diltiazem  3)Diabetes; managed by  Dr. Eddie Dibbles   Electronic Signatures: Ida Rogue (MD)  (Signed 26-Oct-14 11:56)  Authored: General Aspect/Present Illness, History and Physical Exam, Review of System, Home Medications, Labs, EKG , Radiology, Allergies, Vital Signs/Nurse's Notes, Impression/Plan   Last Updated: 26-Oct-14 11:56 by Ida Rogue (MD)

## 2014-08-20 NOTE — Consult Note (Signed)
Pt hyponatremia may be due to SIADH which can be brought on by pulmonary problems such as atelectasis or effusions, made worse by decreased cardiac output and renal perfusion.  Obviously no elective EGD can be done until pul status improved.  Hgb stable and climbing, no evidence of continuing bleed.  Will follow with you.  Electronic Signatures: Scot Jun (MD)  (Signed on 12-Dec-14 14:15)  Authored  Last Updated: 12-Dec-14 14:15 by Scot Jun (MD)

## 2014-08-20 NOTE — Consult Note (Signed)
I will put pt on for tentative EGD tomorrow depending on condition in morning.    Electronic Signatures: Scot Jun (MD)  (Signed on 15-Dec-14 17:53)  Authored  Last Updated: 15-Dec-14 17:53 by Scot Jun (MD)

## 2014-08-21 NOTE — H&P (Signed)
PATIENT NAME:  Joseph Burgess, NEE MR#:  [id number] DATE OF BIRTH:  01-11-1930  DATE OF ADMISSION:  04/07/2013  ADMITTING PHYSICIAN: Enid Baas, MD   PRIMARY CARE PHYSICIAN: Einar Crow, MD   PRIMARY CARDIOLOGIST: Julien Nordmann, MD   CHIEF COMPLAINT: Shortness of breath and belching.   HISTORY OF PRESENT ILLNESS: Joseph Burgess is an 81 year old Caucasian male with past medical history significant for hypertension, diabetes, and hypothyroidism; history of Dieulafoy lesion in the past about 40 years ago, requiring blood transfusion at the time, status post clipping of that; recent history of atrial fibrillation in October 2014, was started on Eliquis, status post successful cardioversion in November. Since then, the patient has been having melenic stools again, so his Eliquis was stopped because he is successfully cardioverted to normal sinus rhythm at this time. However, because his hemoglobin was in the low-normal range around 8.9 and ongoing melena, he started seeing GI again. He was started on iron supplements. Since the last week since the iron supplements were started, the patient has been having severe nausea, ongoing belching, indigestion symptoms. His hemoglobin was stable without any drops, so GI has decided to do an upper GI endoscopy, likely next week, to see if there is any recurrence of his ulcers in the stomach. However, the patient was having worsening of his GI symptoms last night associated with shortness of breath so presented to the ER. In the ER, he was noted to have elevated troponin of 2.4 and CK-MB of 12.2. The patient denies any chest pain. His hemoglobin is stable at 9.5, and his guaiac is positive for Hemoccult.   PAST MEDICAL HISTORY: 1.  Hypertension.  2.  Diabetes mellitus.  3.  Hypothyroidism.  4.  History of Dieulafoy lesion resulting in GI bleed, status post clipping at the time. 5.  Recent atrial fibrillation October 24, status post successful cardioversion in  November 2014.   PAST SURGICAL HISTORY: Cataract surgery bilaterally.    ALLERGIES TO MEDICATIONS: COLD MEDICATION AND ORNADE.   CURRENT HOME MEDICATIONS:  1.  Amiodarone 200 mg p.o. daily.  2.  Carvedilol 6.25 mg p.o. in the morning and 3.125 mg at bedtime.  3.  Diltiazem 30 mg once a day as needed for hypertension. 4.  Insulin pump, Humalog.  5.  Lovastatin 20 mg p.o. daily.  6.  Synthroid 100 mcg p.o. daily.   SOCIAL HISTORY: Lives at home with his wife. No history of any smoking. No alcohol use.   FAMILY HISTORY: Heart disease runs in the family, brother and father with heart problems.   REVIEW OF SYSTEMS:    CONSTITUTIONAL: No fever, fatigue or weakness.  EYES: No blurred vision, double vision, inflammation or glaucoma.  EARS, NOSE, THROAT: No tinnitus, ear pain, hearing loss, epistaxis or discharge.  RESPIRATORY: No cough, wheeze or hemoptysis. No COPD.  CARDIOVASCULAR: No chest pain. Positive for dyspnea on exertion. Positive for pedal edema. No palpitations or syncope.  GASTROINTESTINAL: Positive for nausea. No vomiting or diarrhea. No abdominal pain. Positive for significant belching and also melena.  GENITOURINARY: No dysuria, hematuria, renal calculus, frequency or incontinence.  ENDOCRINE: No polyuria, nocturia, thyroid problems, heat or cold intolerance.  HEMATOLOGY: No anemia, easy bruising or bleeding.  SKIN: No acne, rash or lesions.  MUSCULOSKELETAL: No neck, back, shoulder pain, arthritis or gout.  NEUROLOGIC: No numbness, weakness, CVA, TIA or seizures.  PSYCHOLOGIC: No anxiety, insomnia or depression.   PHYSICAL EXAMINATION: VITAL SIGNS: Temperature 97.4 degrees Fahrenheit, pulse 81, respirations 18, blood pressure  136/82, pulse oximetry 98% on 3 liters oxygen.  GENERAL: Well-built, well-nourished male, sitting in bed in mild respiratory distress due to dyspnea.  HEENT: Normocephalic, atraumatic. Pupils postsurgical, equal, round, reacting to light bilaterally.  Anicteric sclerae. Extraocular movements intact. Oropharynx clear without erythema, mass or exudates.  NECK: Supple. No thyromegaly JVD or carotid bruits. No lymphadenopathy.  LUNGS: Moving air bilaterally. Fine bibasilar rales. No wheeze or rhonchi heard. No use of accessory muscles for breathing.  CARDIOVASCULAR: S1, S2, regular rate and rhythm. No murmurs, rubs or gallops.  ABDOMEN: Obese, soft, nontender, nondistended. No hepatosplenomegaly. Normal bowel sounds.  EXTREMITIES: 1+ pedal edema up to the knees with palpable dorsalis pedis pulses bilaterally. No clubbing or cyanosis.  LYMPHATICS: No cervical lymphadenopathy.  SKIN: No acne, rash or lesions.  NEUROLOGIC: Cranial nerves intact. No focal motor or sensory deficits.  PSYCHOLOGICAL: The patient is awake, alert, oriented x 3.   LABORATORY DATA: WBC 7.0, hemoglobin 9.5, hematocrit 27.8, platelet count 295.   Sodium 137, potassium 4.8, chloride 101, bicarbonate 29, BUN 49, creatinine 1.69, glucose 178 and calcium of 9.7. BNP is elevated at 10,561. CK 156, CK-MB 13.1, troponin of 2.4. PTT 27.2. INR is 1.0.  Chest x-ray revealing bilateral perihilar infiltrates with peribronchial thickening highly suspicious for bilateral pneumonia. Followup until resolution is recommended.   EKG showing normal sinus rhythm, heart rate of 79, with ST depressions noted in lateral leads, V4 to V6.   ASSESSMENT AND PLAN: This is an 81 year old male with a history of hypertension, diabetes, recent history of atrial fibrillation, status post successful cardioversion last month, stopped Eliquis due to melenic stools, presents to the hospital secondary to worsening dyspnea and noted to have elevated troponins.  1.  Non-ST segment elevation myocardial infarction: Not sure what the cause is. No prior cardiac history other than the atrial fibrillation and successful cardioversion, but unable to give any heparin or Lovenox at this time due to ongoing gastrointestinal  bleed. Cardiology has been consulted. We will admit to telemetry. Recycle cardiac enzymes. Start low-dose aspirin at this time, statin, and Coreg. Start nitroglycerin p.r.n. Also recent echo done in October 2014 showing normal ejection fraction of 50% to 55%.  2.  Dyspnea: Chest x-ray results looked at and show possible bilateral pneumonia. Would benefit from getting a CT chest without contrast for more parenchymal evaluation. Will order stat blood cultures and also start on Rocephin and azithromycin.  3.  History of gastrointestinal bleed with melena currently. History of Dieulafoy lesion requiring transfusions in the past. Eliquis has been stopped since melena started. The patient is in normal sinus rhythm. Will get GI consult. start on IV Protonix. He will need EGD either inpatient or outpatient depending upon his cardiac situation.  4.  Hypertension: Because of recent labile pressures as an outpatient, his medications were stopped. For now, continue Coreg and nitroglycerin p.r.n.  5.  Diabetes mellitus: Continue insulin pump.  HbA1c has been ordered.  6.  Acute renal failure: Could be from underlying diabetic nephropathy versus acute tubular necrosis due to recent hypertension as an outpatient. Avoid nephrotoxins. Continue to monitor for now.  7.  Gastrointestinal and deep vein thrombosis prophylaxis.   CODE STATUS: Full code.   TIME SPENT ON ADMISSION: 50 minutes.   ____________________________ Enid Baas, MD [id number] D: 04/07/2013 17:15:03 ET T: 04/07/2013 17:35:52 ET JOB#: [id number]  cc: Enid Baas, MD, <Dictator> Marya Amsler. Dareen Piano, MD Antonieta Iba, MD Enid Baas MD ELECTRONICALLY SIGNED 05/12/2013 14:22

## 2015-09-18 IMAGING — CR DG CHEST 1V PORT
1 series · 1 of 1 positions shown · non-contrast
Comparison: none

REASON FOR EXAM: Chest pain
COMMENTS:

PROCEDURE:     DXR - DXR PORTABLE CHEST SINGLE VIEW  - February 21, 2013  [DATE]
RESULT:     The lungs are clear. The cardiac silhouette and visualized bony
skeleton are unremarkable.

[ap]
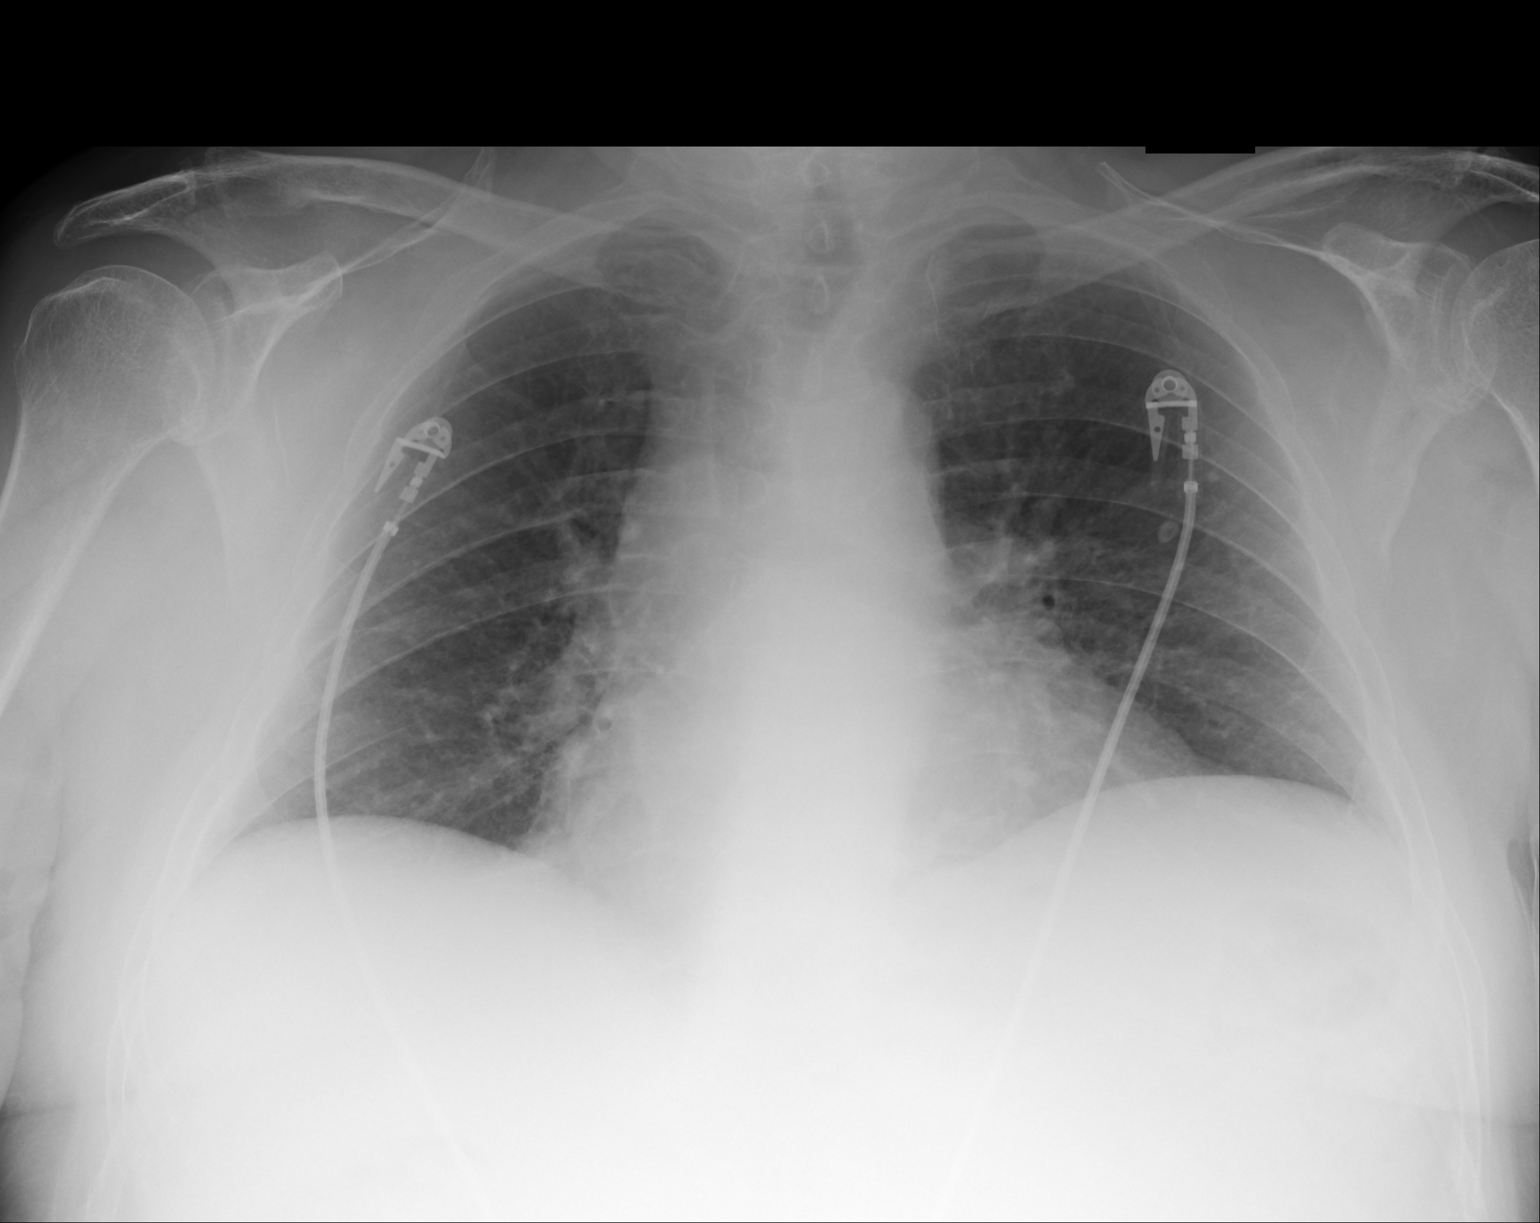

[1 of 1 positions shown; findings below may reference images not displayed]

IMPRESSION: 1. Chest radiograph without evidence of acute cardiopulmonary disease.

## 2015-11-12 IMAGING — CT CT HEAD WITHOUT CONTRAST
3 of 4 series · 17 of 30 positions shown, 19 images · non-contrast
Comparison: None

CLINICAL DATA: Confusion, lethargy, encephalopathy

EXAM:
CT HEAD WITHOUT CONTRAST
TECHNIQUE: Contiguous axial images were obtained from the base of the skull
through the vertex without contrast.

[Series 3: head wo · axial · 0.46mm/px · z∈[-131,-23]mm · 7 of 34 slices shown, 9 images]
[im 5/34  brain]
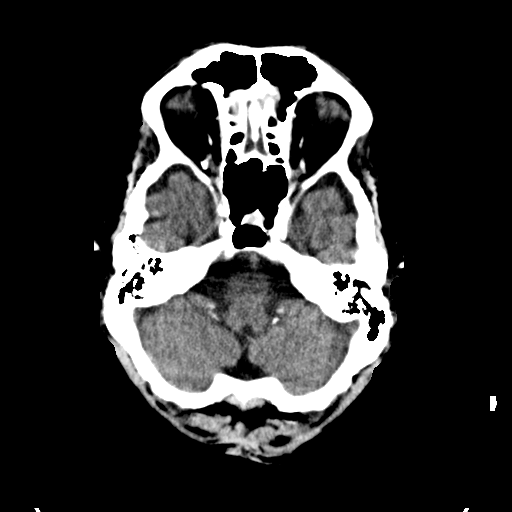
[im 5/34  bone]
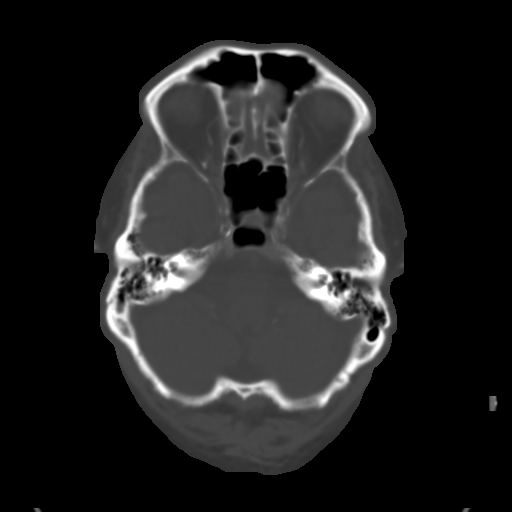
[im 9/34  brain]
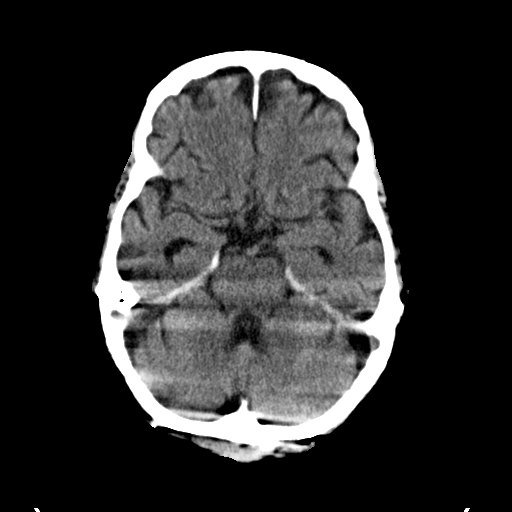
[im 13/34  brain]
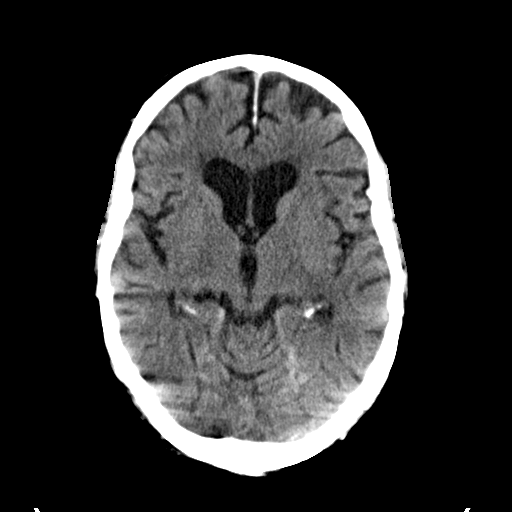
[im 17/34  brain]
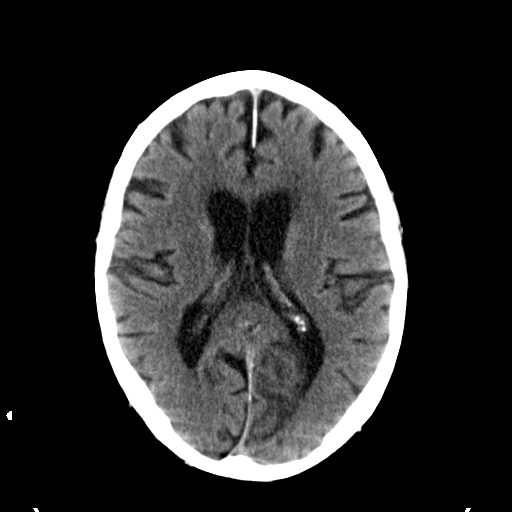
[im 21/34  brain]
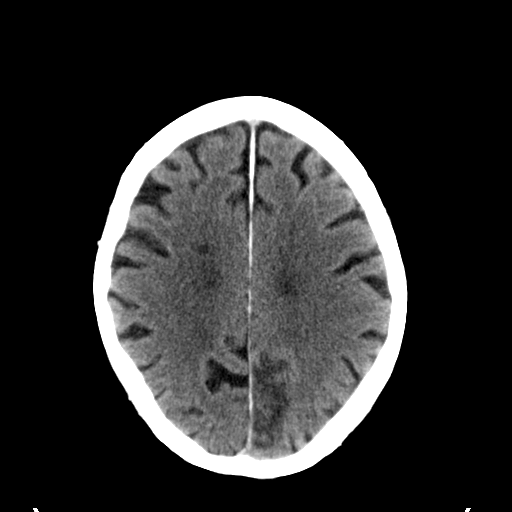
[im 21/34  bone]
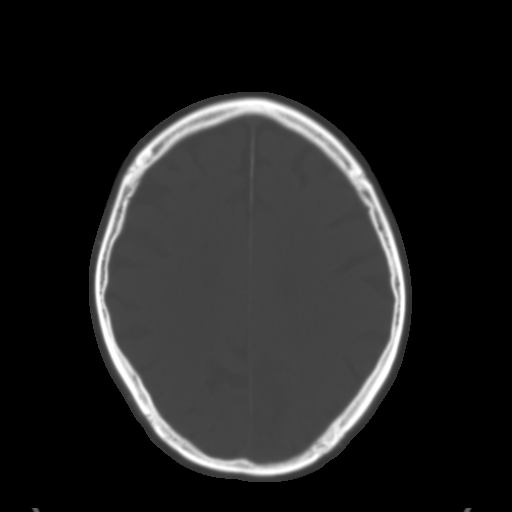
[im 25/34  brain]
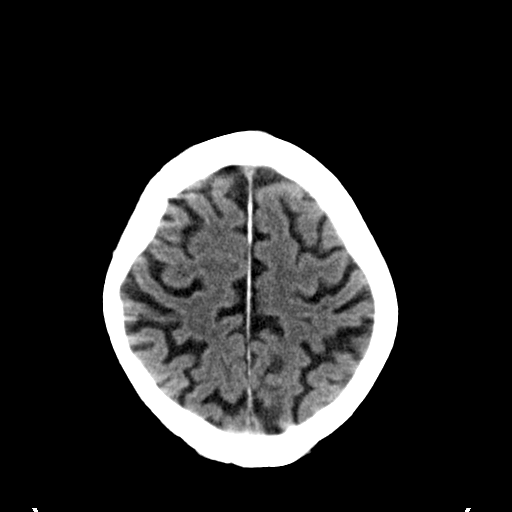
[im 29/34  brain]
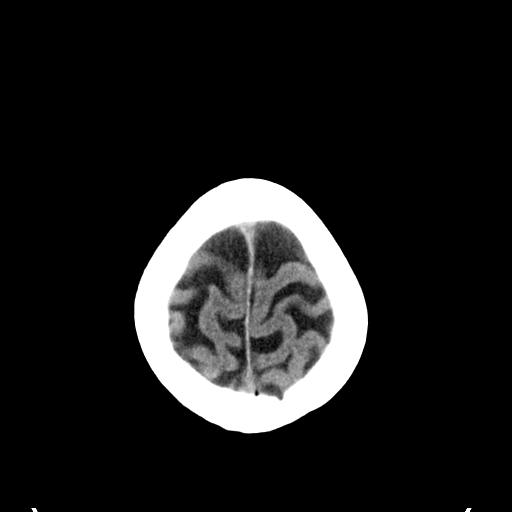

[Series 4: soft tissue · axial · 0.43mm/px · z∈[-164,-64]mm · 5 of 30 slices shown]
[im 5/30  brain]
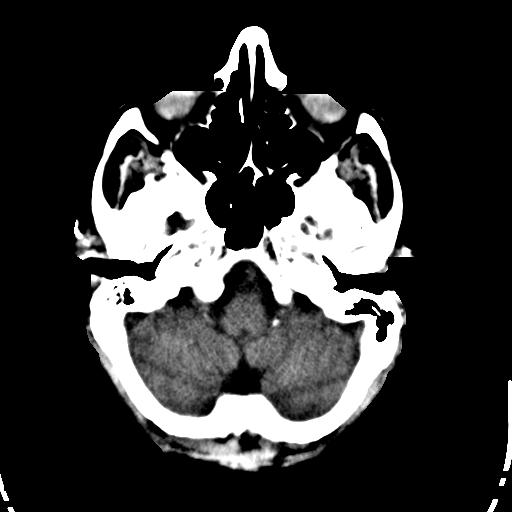
[im 10/30  brain]
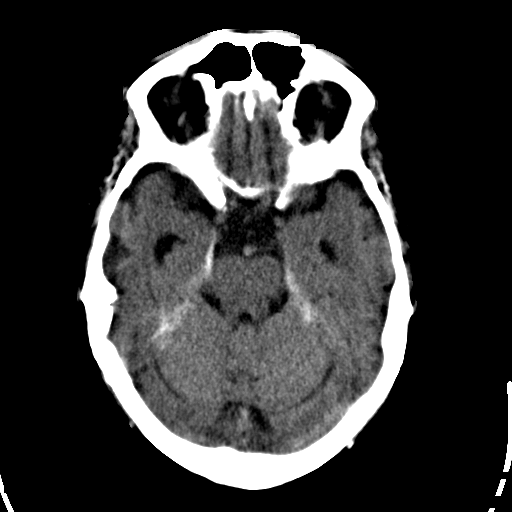
[im 15/30  brain]
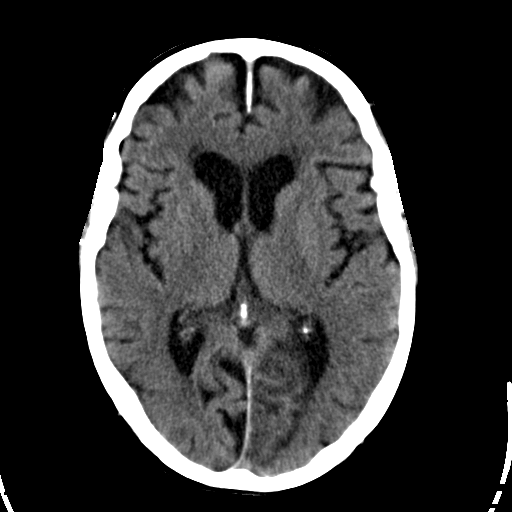
[im 20/30  brain]
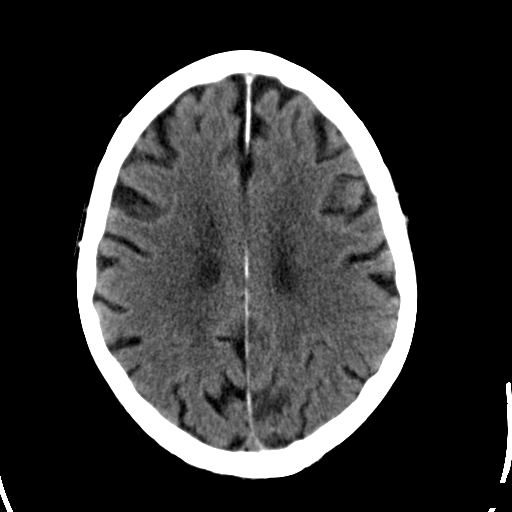
[im 25/30  brain]
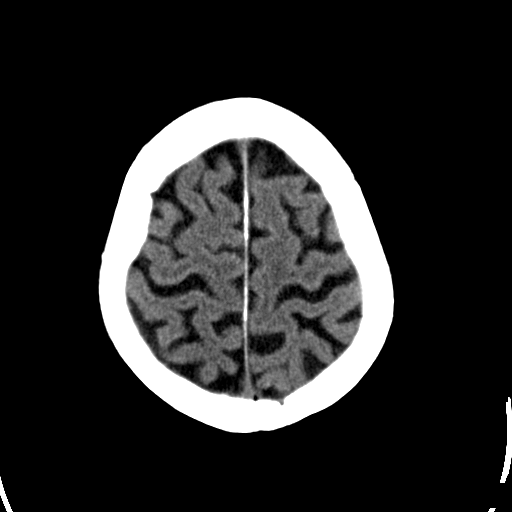

[Series 7: soft tissue recon · axial · 0.43mm/px · z∈[-156,-56]mm · 5 of 30 slices shown]
[im 5/30  brain]
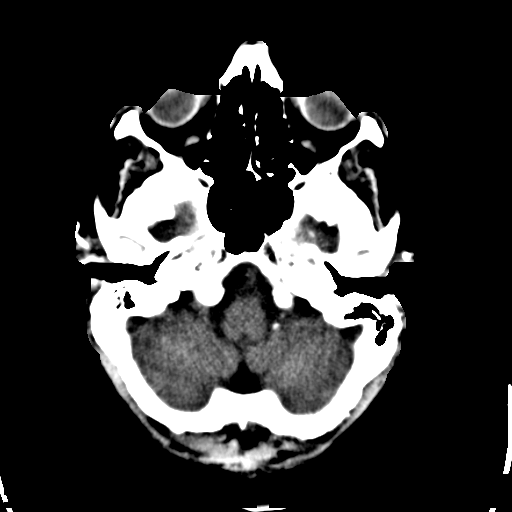
[im 10/30  brain]
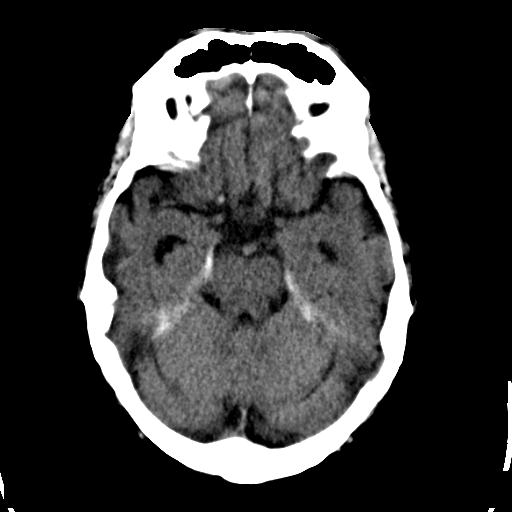
[im 15/30  brain]
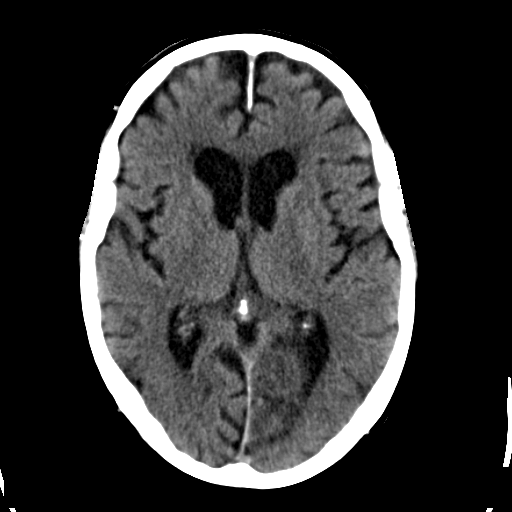
[im 20/30  brain]
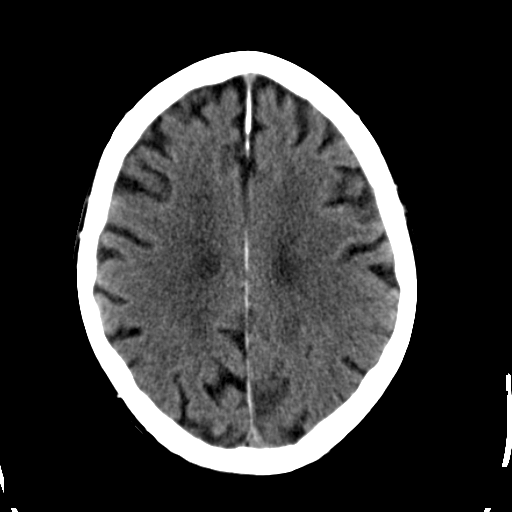
[im 25/30  brain]
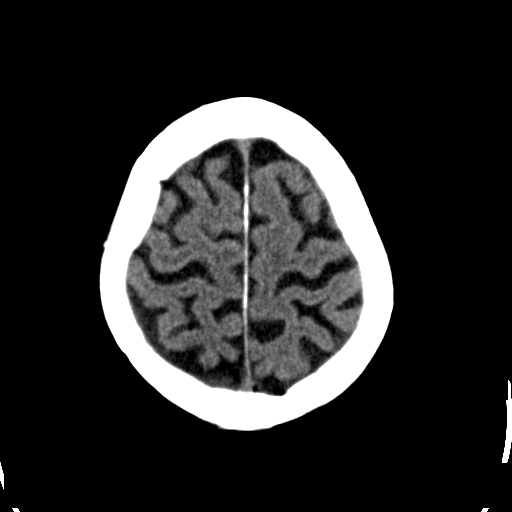

[17 of 30 positions shown; findings below may reference images not displayed]

FINDINGS: Diffuse brain atrophy evident with chronic white matter
microvascular ischemic changes. Cerebellar atrophy as well. Left
occipital hypodensity noted compatible with a PCA territory infarct,
appearing subacute to chronic. No acute intracranial hemorrhage,
mass lesion, midline shift, herniation, hydrocephalus, or
extra-axial fluid collection. Cisterns patent. Cerebellar atrophy as
well. Orbits unremarkable. Mastoids and sinuses clear. No skull
abnormality.
IMPRESSION: Brain atrophy and chronic white matter microvascular ischemic
change.

Left occipital PCA territory infarct, appearing subacute to chronic.
